# Patient Record
Sex: Female | Born: 1955 | Race: White | Hispanic: No | Marital: Married | Smoking: Never smoker
Health system: Southern US, Community
[De-identification: ages and names within clinical notes are randomized; demographics above are authoritative.]

## PROBLEM LIST (undated history)

## (undated) DIAGNOSIS — D649 Anemia, unspecified: Secondary | ICD-10-CM

## (undated) DIAGNOSIS — T148XXA Other injury of unspecified body region, initial encounter: Secondary | ICD-10-CM

## (undated) DIAGNOSIS — K9189 Other postprocedural complications and disorders of digestive system: Secondary | ICD-10-CM

## (undated) DIAGNOSIS — K567 Ileus, unspecified: Secondary | ICD-10-CM

## (undated) HISTORY — PX: TONSILLECTOMY: SUR1361

## (undated) HISTORY — PX: BACK SURGERY: SHX140

## (undated) HISTORY — DX: Anemia, unspecified: D64.9

---

## 2005-10-18 ENCOUNTER — Encounter: Admission: RE | Admit: 2005-10-18 | Discharge: 2005-10-18 | Payer: Self-pay | Admitting: Family Medicine

## 2006-06-29 ENCOUNTER — Other Ambulatory Visit: Admission: RE | Admit: 2006-06-29 | Discharge: 2006-06-29 | Payer: Self-pay | Admitting: Obstetrics & Gynecology

## 2006-11-08 ENCOUNTER — Encounter: Admission: RE | Admit: 2006-11-08 | Discharge: 2006-11-08 | Payer: Self-pay | Admitting: Family Medicine

## 2009-11-20 ENCOUNTER — Emergency Department (HOSPITAL_COMMUNITY): Admission: EM | Admit: 2009-11-20 | Discharge: 2009-11-21 | Payer: Self-pay | Admitting: Emergency Medicine

## 2009-11-22 ENCOUNTER — Telehealth: Payer: Self-pay | Admitting: Family Medicine

## 2009-11-22 ENCOUNTER — Ambulatory Visit: Payer: Self-pay | Admitting: Family Medicine

## 2009-11-22 ENCOUNTER — Other Ambulatory Visit
Admission: RE | Admit: 2009-11-22 | Discharge: 2009-11-22 | Payer: Self-pay | Source: Home / Self Care | Admitting: Family Medicine

## 2009-11-22 DIAGNOSIS — R599 Enlarged lymph nodes, unspecified: Secondary | ICD-10-CM | POA: Insufficient documentation

## 2009-11-22 LAB — CONVERTED CEMR LAB: Pap Smear: NEGATIVE

## 2009-11-23 ENCOUNTER — Ambulatory Visit: Payer: Self-pay | Admitting: Family Medicine

## 2009-11-23 ENCOUNTER — Ambulatory Visit: Payer: Self-pay | Admitting: Oncology

## 2009-11-23 LAB — CONVERTED CEMR LAB
ALT: 20 units/L (ref 0–35)
BUN: 9 mg/dL (ref 6–23)
Basophils Absolute: 0.1 10*3/uL (ref 0.0–0.1)
Bilirubin, Direct: 0 mg/dL (ref 0.0–0.3)
CA 125: 4.4 units/mL (ref 0.0–30.2)
Chloride: 106 meq/L (ref 96–112)
Cholesterol: 205 mg/dL — ABNORMAL HIGH (ref 0–200)
Creatinine, Ser: 0.9 mg/dL (ref 0.4–1.2)
Direct LDL: 95.3 mg/dL
Eosinophils Absolute: 0.1 10*3/uL (ref 0.0–0.7)
Glucose, Bld: 98 mg/dL (ref 70–99)
HCT: 36.7 % (ref 36.0–46.0)
LDH: 164 units/L (ref 94–250)
Lymphs Abs: 1 10*3/uL (ref 0.7–4.0)
MCHC: 33.8 g/dL (ref 30.0–36.0)
MCV: 95 fL (ref 78.0–100.0)
Monocytes Absolute: 0.4 10*3/uL (ref 0.1–1.0)
Neutrophils Relative %: 69.8 % (ref 43.0–77.0)
Platelets: 260 10*3/uL (ref 150.0–400.0)
Potassium: 3.8 meq/L (ref 3.5–5.1)
RDW: 12.6 % (ref 11.5–14.6)
TSH: 2.26 microintl units/mL (ref 0.35–5.50)
Total Bilirubin: 0.8 mg/dL (ref 0.3–1.2)
VLDL: 12.8 mg/dL (ref 0.0–40.0)

## 2009-11-24 ENCOUNTER — Encounter: Admission: RE | Admit: 2009-11-24 | Discharge: 2009-11-24 | Payer: Self-pay | Admitting: Family Medicine

## 2009-11-24 LAB — HM MAMMOGRAPHY

## 2009-11-26 ENCOUNTER — Telehealth: Payer: Self-pay | Admitting: Family Medicine

## 2010-01-04 ENCOUNTER — Telehealth: Payer: Self-pay | Admitting: Family Medicine

## 2010-03-10 ENCOUNTER — Telehealth: Payer: Self-pay | Admitting: Family Medicine

## 2010-06-13 ENCOUNTER — Ambulatory Visit: Payer: Self-pay | Admitting: Family Medicine

## 2010-06-13 LAB — CONVERTED CEMR LAB
ALT: 37 units/L — ABNORMAL HIGH (ref 0–35)
AST: 66 units/L — ABNORMAL HIGH (ref 0–37)
Albumin: 4.3 g/dL (ref 3.5–5.2)
Alkaline Phosphatase: 92 units/L (ref 39–117)
BUN: 16 mg/dL (ref 6–23)
Basophils Absolute: 0 10*3/uL (ref 0.0–0.1)
Basophils Relative: 0.6 % (ref 0.0–3.0)
Bilirubin, Direct: 0.1 mg/dL (ref 0.0–0.3)
CO2: 33 meq/L — ABNORMAL HIGH (ref 19–32)
Calcium: 9.9 mg/dL (ref 8.4–10.5)
Chloride: 100 meq/L (ref 96–112)
Creatinine, Ser: 1 mg/dL (ref 0.4–1.2)
Eosinophils Absolute: 0.3 10*3/uL (ref 0.0–0.7)
Eosinophils Relative: 4.6 % (ref 0.0–5.0)
GFR calc non Af Amer: 64.72 mL/min (ref >60)
Glucose, Bld: 93 mg/dL (ref 70–99)
HCT: 38.7 % (ref 36.0–46.0)
Hemoglobin: 12.9 g/dL (ref 12.0–15.0)
Lymphocytes Relative: 28.7 % (ref 12.0–46.0)
Lymphs Abs: 1.9 10*3/uL (ref 0.7–4.0)
MCHC: 33.3 g/dL (ref 30.0–36.0)
MCV: 94.2 fL (ref 78.0–100.0)
Monocytes Absolute: 0.6 10*3/uL (ref 0.1–1.0)
Monocytes Relative: 8.5 % (ref 3.0–12.0)
Neutro Abs: 3.8 10*3/uL (ref 1.4–7.7)
Neutrophils Relative %: 57.6 % (ref 43.0–77.0)
Platelets: 279 10*3/uL (ref 150.0–400.0)
Potassium: 5.3 meq/L — ABNORMAL HIGH (ref 3.5–5.1)
RBC: 4.1 M/uL (ref 3.87–5.11)
RDW: 13.6 % (ref 11.5–14.6)
Sodium: 142 meq/L (ref 135–145)
Total Bilirubin: 0.5 mg/dL (ref 0.3–1.2)
Total Protein: 7.2 g/dL (ref 6.0–8.3)
WBC: 6.6 10*3/uL (ref 4.5–10.5)

## 2010-06-15 DIAGNOSIS — L820 Inflamed seborrheic keratosis: Secondary | ICD-10-CM | POA: Insufficient documentation

## 2010-06-16 ENCOUNTER — Telehealth: Payer: Self-pay | Admitting: Family Medicine

## 2010-06-21 ENCOUNTER — Ambulatory Visit (HOSPITAL_COMMUNITY): Admission: RE | Admit: 2010-06-21 | Discharge: 2010-06-21 | Payer: Self-pay | Admitting: Oncology

## 2010-07-06 ENCOUNTER — Telehealth: Payer: Self-pay | Admitting: Family Medicine

## 2010-09-18 ENCOUNTER — Encounter: Payer: Self-pay | Admitting: Oncology

## 2010-09-18 ENCOUNTER — Encounter: Payer: Self-pay | Admitting: Family Medicine

## 2010-09-27 NOTE — Progress Notes (Signed)
Summary: ativan refill  Phone Note Refill Request Message from:  Fax from Pharmacy on July 06, 2010 11:35 AM  Refills Requested: Medication #1:  ATIVAN 1 MG TABS 1 tab @ bedtime Initial call taken by: Kern Reap CMA [name]),  July 06, 2010 11:35 AM    Prescriptions: ATIVAN 1 MG TABS (LORAZEPAM) 1 tab @ bedtime  #30 x 5   Entered by:   Kern Reap CMA (AAMA)   Authorized by:   [name] MD   Signed by:   Kern Reap CMA (AAMA) on 07/06/2010   Method used:   Handwritten   RxID:   [id number]

## 2010-09-27 NOTE — Assessment & Plan Note (Signed)
Summary: NEW PT EST (POST ED VISIT F/U) // RS   Vital Signs:  Patient profile:   55 year old female Height:      68.5 inches Weight:      212 pounds BMI:     31.88 Temp:     98.6 degrees F oral  Vitals Entered By: Kern Reap CMA [name]) (November 22, 2009 4:21 PM) CC: follow-up visit ER, new to establish care Is Patient Diabetic? No   CC:  follow-up visit ER and new to establish care.  History of Present Illness: Tracy Bradley is any 55 year old, married female, nonsmoker gravida 4, para 3 postmenopausal LMP at age 54, who comes in today for evaluation of possible lymphoma.  Over the weekend on Saturday she develop severe pain and went to the emergency room.  Her symptoms were consistent with a kidney stone.  CT scan showed a 3-mm calcified lesion in the bladder, which they felt was the stone.  However, it also showed enlargement of the mesenteric lymph nodes.  No associated increase in the retroperitoneal lymph nodes were noted.  Over the past couple months.  She has noticed some bloating....... two weeks ago   She had some fever for a couple hours not  associated with sore throat, etc.  She has had no  weight loss.  She has some night sweats, but that's been pretty persistent since she went through menopause, not changed recently.   her last physical and Pap and mammogram were about two years ago.  Review of systems otherwise negative  she will have a copy of all her vaccinations.  Faxed to me .    Preventive Screening-Counseling & Management  Alcohol-Tobacco     Smoking Status: never  Hep-HIV-STD-Contraception     Dental Visit-last 6 months yes      Drug Use:  no.    Allergies (verified): No Known Drug Allergies  Past History:  Past medical, surgical, family and social histories (including risk factors) reviewed, and no changes noted (except as noted below).  Past Surgical History: back surgery Tonsillectomy  Family History: Reviewed history and no changes  required. Father: non-hodgkins lympoma Mother: thyroid  Siblings: 2 brothers - healthy Children: 3 - healthy  Social History: Reviewed history and no changes required. 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 No apparent alopecia or balding. Eyes:  No corneal or conjunctival inflammation noted. EOMI. Perrla. Funduscopic exam benign, without hemorrhages, exudates or papilledema. Vision grossly normal. Ears:  External ear exam shows no significant lesions or deformities.  Otoscopic examination reveals clear canals, tympanic membranes are intact bilaterally without bulging, retraction, inflammation or discharge. Hearing is grossly normal bilaterally. Nose:  External nasal examination shows no deformity or inflammation. Nasal mucosa are pink and moist without lesions or exudates. Mouth:  Oral mucosa and oropharynx without lesions or exudates.  Teeth in good repair. Neck:  No deformities, masses, or tenderness noted. Chest Wall:  No deformities, masses, or tenderness noted. Breasts:  No mass, nodules, thickening, tenderness, bulging, retraction, inflamation, nipple discharge or skin changes noted.   Lungs:  Normal respiratory effort, chest expands symmetrically. Lungs are clear to auscultation, no crackles or wheezes. Heart:  Normal rate and regular  rhythm. S1 and S2 normal without gallop, murmur, click, rub or other extra sounds. Abdomen:  Bowel sounds positive,abdomen soft and non-tender without masses, organomegaly or hernias noted. Rectal:  No external abnormalities noted. Normal sphincter tone. No rectal masses or tenderness. Genitalia:  externa  genitalia within normal limits.  Vaginal vault was normal.  Cervix was visualized.  Pap smear was done.  There is a cervical polyp.  She's been told there was a polyp in the past.  Bimanual exam negative Msk:  No deformity or scoliosis noted of thoracic or lumbar spine.   Pulses:  R and L carotid,radial,femoral,dorsalis pedis and posterior tibial pulses are full and equal bilaterally Extremities:  No clubbing, cyanosis, edema, or deformity noted with normal full range of motion of all joints.   Neurologic:  No cranial nerve deficits noted. Station and gait are normal. Plantar reflexes are down-going bilaterally. DTRs are symmetrical throughout. Sensory, motor and coordinative functions appear intact. Skin:  Intact without suspicious lesions or rashes Cervical Nodes:  No lymphadenopathy noted Axillary Nodes:  No palpable lymphadenopathy Inguinal Nodes:  No significant adenopathy Psych:  Cognition and judgment appear intact. Alert and cooperative with normal attention span and concentration. No apparent delusions, illusions, hallucinations   Impression & Recommendations:  Problem # 1:  LYMPHADENOPATHY (ICD-785.6) Assessment New  Orders: EKG w/ Interpretation (93000) Venipuncture (16109) T-CA 125 (60454-09811) T-Lactate Dehydrogenase (LDH) (91478-29562) T- * Misc. Laboratory test [phone]) TLB-Lipid Panel (80061-LIPID) TLB-BMP (Basic Metabolic Panel-BMET) (80048-METABOL) TLB-CBC Platelet - w/Differential (85025-CBCD) TLB-Hepatic/Liver Function Pnl (80076-HEPATIC) TLB-TSH (Thyroid Stimulating Hormone) (84443-TSH) T-2 View CXR (71020TC)  Complete Medication List: 1)  Ativan 1 Mg Tabs (Lorazepam) .Marland Kitchen.. 1 tab @ bedtime  Patient Instructions: 1)  call and get your mammogram tomorrow.  Also, chest x-ray, and I will call in two to set up for an oncology appointment ASAP 2)  Take an Aspirin every day. Prescriptions: ATIVAN 1 MG TABS (LORAZEPAM) 1 tab @ bedtime  #30 x 3   Entered and  Authorized by:   [name] MD   Signed by:   [name] MD on 11/22/2009   Method used:   Print then Give to Patient   RxID:   [id number]

## 2010-09-27 NOTE — Progress Notes (Signed)
Summary: lab results  Phone Note Call from Patient Call back at Home Phone [phone]   Caller: Patient Call For: [name] MD Reason for Call: Lab or Test Results Summary of Call: patient is calling because she took 5-6 Motrin on Sunday night.  she would like to know if this could cause an increase in her AST, ALT results? Initial call taken by: Kern Reap CMA [name]),  June 16, 2010 10:17 AM  Follow-up for Phone Call        no Follow-up by: [name] MD,  June 16, 2010 12:56 PM  Additional Follow-up for Phone Call Additional follow up Details #1::        Phone Call Completed Additional Follow-up by: Kern Reap CMA [name]),  June 17, 2010 8:46 AM

## 2010-09-27 NOTE — Progress Notes (Signed)
Summary: RETURN CALL  Phone Note Call from Patient   Caller: Patient  [phone] Reason for Call: Talk to Doctor Summary of Call: Pt adv that she was returning Dr Nelida Meuse call.... Please return call at [phone].  Initial call taken by: [name],  November 26, 2009 8:04 AM  Follow-up for Phone Call        good news from Dr Truett Perna Follow-up by: Kern Reap CMA [name]),  November 26, 2009 12:01 PM

## 2010-09-27 NOTE — Assessment & Plan Note (Signed)
Summary: consult re: suspicious looking lesion on chest/cjr   Vital Signs:  Patient profile:   55 year old female Weight:      201 pounds Temp:     98.3 degrees F oral BP sitting:   120 / 78  (left arm) Cuff size:   regular  Vitals Entered By: Kern Reap CMA [name]) (June 13, 2010 12:08 PM)  Procedure Note  Mole Biopsy/Removal: Indication: rule out cancer Consent signed: yes  Procedure # 1: elliptical incision with 3 mm margin    Size (in cm): 12 mm x 14mm    Region: anterior    Location: chest-upper-midline    Instrument used: #15 blade    Anesthesia: 1% lidocaine w/epinephrine    Closure: cautery  Cleaned and prepped with: alcohol Wound dressing: neosporin and bandaid  CC: red area upper chest   CC:  red area upper chest.  History of Present Illness:  Tracy Bradley is a 55 year old red haired female, who comes in today for evaluation of a lesion on her anterior chest wall has been growing.    Allergies: No Known Drug Allergies   Complete Medication List: 1)  Ativan 1 Mg Tabs (Lorazepam) .Marland Kitchen.. 1 tab @ bedtime 2)  Premarin 0.625 Mg/gm Crea (Estrogens, conjugated) .... Use as directed  Other Orders: Venipuncture (09811) T-LDH Isoenzyme (91478-29562) T- * Misc. Laboratory test [phone]) Specimen Handling (57846) TLB-BMP (Basic Metabolic Panel-BMET) (80048-METABOL) TLB-CBC Platelet - w/Differential (85025-CBCD) TLB-Hepatic/Liver Function Pnl (80076-HEPATIC) Shave Skin Lesion 1.1-2.0 cm/trunk/arm/leg (96295)   Orders Added: 1)  Venipuncture [28413] 2)  T-LDH Isoenzyme [24401-02725] 3)  T- * Misc. Laboratory test [99999] 4)  Specimen Handling [99000] 5)  TLB-BMP (Basic Metabolic Panel-BMET) [80048-METABOL] 6)  TLB-CBC Platelet - w/Differential [85025-CBCD] 7)  TLB-Hepatic/Liver Function Pnl [80076-HEPATIC] 8)  Shave Skin Lesion 1.1-2.0 cm/trunk/arm/leg [36644]

## 2010-09-27 NOTE — Progress Notes (Signed)
Summary: ativan refill  Phone Note Refill Request Message from:  Fax from Pharmacy on March 10, 2010 5:03 PM  Refills Requested: Medication #1:  ATIVAN 1 MG TABS 1 tab @ bedtime Initial call taken by: Kern Reap CMA (AAMA),  March 10, 2010 5:03 PM    Prescriptions: ATIVAN 1 MG TABS (LORAZEPAM) 1 tab @ bedtime  #30 x 3   Entered by:   Kern Reap CMA (AAMA)   Authorized by:   [name] MD   Signed by:   Kern Reap CMA (AAMA) on 03/10/2010   Method used:   Telephoned to ...       [facility]  [place] [address] (retail)       [address]       [place], [place]  [address]       Ph: [phone] or [phone]       Fax: [phone]   RxID:   [id number]

## 2010-09-27 NOTE — Progress Notes (Signed)
Summary: REQ FOR NEW  PT APPT  Phone Note Call from Patient   Caller: Spouse  [name])  [phone] Reason for Call: Talk to Doctor Summary of Call: [name] called to see if there was anyway that his wife can be seen either today or tomorrow as a new pt.... His wife went to the ED w/ sxs of flank pain, nausea...Marland KitchenMarland Kitchen Pt was dx w/ kidney stones but during the scan, they found something they were concerned with and adv pt that it may be lymphoma??  Okay to schedule pt for appt as a new pt est tomorrow at 12:15pm slot??  Pt / Pts husband can be reached at [phone] with any questions or concerns.  Please advise when you get a chance..... Thanks,  Respectfully,   [name] / Scheduling Initial call taken by: [name],  November 22, 2009 1:37 PM  Follow-up for Phone Call        [name] please call and get her in today Follow-up by: [name] MD,  November 22, 2009 1:44 PM  Additional Follow-up for Phone Call Additional follow up Details #1::        Phone Call Completed Additional Follow-up by: Kern Reap CMA [name]),  November 22, 2009 2:06 PM     Appended Document: REQ FOR NEW  PT APPT Appt scheduled for today, 11/22/2009 at 4:30pm..... Okay per Dr Tawanna Cooler.

## 2010-09-27 NOTE — Progress Notes (Signed)
Summary: new rx?  Phone Note Call from Patient Call back at Home Phone [phone]   Caller: Patient---live call Summary of Call: Was seen a few weeks ago and Dr Tawanna Cooler was going to rx'd some estrogen cream. Please [facility] in a [facility]. Initial call taken by: Warnell Forester,  Jan 04, 2010 3:23 PM  Follow-up for Phone Call        Premarin vaginal cream, dispense 3 tubes, use as directed, refill x 6 Follow-up by: [name] MD,  Jan 04, 2010 4:05 PM    New/Updated Medications: PREMARIN 0.625 MG/GM CREA (ESTROGENS, CONJUGATED) use as directed Prescriptions: PREMARIN 0.625 MG/GM CREA (ESTROGENS, CONJUGATED) use as directed  #3 tubes x 6   Entered by:   Kern Reap CMA (AAMA)   Authorized by:   [name] MD   Signed by:   Kern Reap CMA (AAMA) on 01/04/2010   Method used:   [facility]        [facility]  [place] [address]       [address]       [place], [place]  [address]       Ph: [phone] or [phone]       Fax: [phone]   RxID:   [id number]

## 2010-11-21 LAB — URINE MICROSCOPIC-ADD ON

## 2010-11-21 LAB — URINALYSIS, ROUTINE W REFLEX MICROSCOPIC
Bilirubin Urine: NEGATIVE
Specific Gravity, Urine: 1.025 (ref 1.005–1.030)
pH: 5.5 (ref 5.0–8.0)

## 2010-11-21 LAB — POCT I-STAT, CHEM 8
Calcium, Ion: 1.22 mmol/L (ref 1.12–1.32)
HCT: 38 % (ref 36.0–46.0)
Hemoglobin: 12.9 g/dL (ref 12.0–15.0)
TCO2: 27 mmol/L (ref 0–100)

## 2010-12-28 ENCOUNTER — Other Ambulatory Visit: Payer: Self-pay | Admitting: *Deleted

## 2010-12-28 MED ORDER — LORAZEPAM 1 MG PO TABS: 1.0000 mg | ORAL_TABLET | Freq: Every day | ORAL | Status: AC

## 2011-05-14 ENCOUNTER — Other Ambulatory Visit: Payer: Self-pay | Admitting: Family Medicine

## 2011-05-30 ENCOUNTER — Other Ambulatory Visit: Payer: Self-pay | Admitting: *Deleted

## 2011-05-30 MED ORDER — LORAZEPAM 1 MG PO TABS: 1.0000 mg | ORAL_TABLET | Freq: Three times a day (TID) | ORAL | Status: AC

## 2011-07-24 ENCOUNTER — Telehealth: Payer: Self-pay | Admitting: Family Medicine

## 2011-08-09 ENCOUNTER — Telehealth: Payer: Self-pay

## 2011-08-09 MED ORDER — FLUCONAZOLE 100 MG PO TABS: ORAL_TABLET | ORAL | Status: DC

## 2011-08-09 NOTE — Telephone Encounter (Signed)
Pt states she was given an antibiotic for a route canal and now pt has a yeast infection.  Pt would like rx sent to pharmacy.

## 2011-08-09 NOTE — Telephone Encounter (Signed)
rx sent.  Left message on machine for patient.

## 2011-08-16 ENCOUNTER — Ambulatory Visit (INDEPENDENT_AMBULATORY_CARE_PROVIDER_SITE_OTHER): Payer: BC Managed Care – PPO | Admitting: Family Medicine

## 2011-08-16 ENCOUNTER — Telehealth: Payer: Self-pay | Admitting: Family Medicine

## 2011-08-16 ENCOUNTER — Encounter: Payer: Self-pay | Admitting: Family Medicine

## 2011-08-16 DIAGNOSIS — S239XXA Sprain of unspecified parts of thorax, initial encounter: Secondary | ICD-10-CM

## 2011-08-16 NOTE — Patient Instructions (Signed)
Muscle Strain A muscle strain, or pulled muscle, occurs when a muscle is over-stretched. A small number of muscle fibers may also be torn. This is especially common in athletes. This happens when a sudden violent force placed on a muscle pushes it past its capacity. Usually, recovery from a pulled muscle takes 1 to 2 weeks. But complete healing will take 5 to 6 weeks. There are millions of muscle fibers. Following injury, your body will usually return to normal quickly. HOME CARE INSTRUCTIONS   While awake, apply ice to the sore muscle for 15 to 20 minutes each hour for the first 2 days. Put ice in a plastic bag and place a towel between the bag of ice and your skin.   Do not use the pulled muscle for several days. Do not use the muscle if you have pain.   You may wrap the injured area with an elastic bandage for comfort. Be careful not to bind it too tightly. This may interfere with blood circulation.   Only take over-the-counter or prescription medicines for pain, discomfort, or fever as directed by your caregiver. Do not use aspirin as this will increase bleeding (bruising) at injury site.   Warming up before exercise helps prevent muscle strains.  SEEK MEDICAL CARE IF:  There is increased pain or swelling in the affected area. MAKE SURE YOU:   Understand these instructions.   Will watch your condition.   Will get help right away if you are not doing well or get worse.  Document Released: 08/14/2005 Document Revised: 04/26/2011 Document Reviewed: 03/13/2007 Exit[employer] Patient Information 2012 [employer]. 

## 2011-08-16 NOTE — Telephone Encounter (Signed)
Patient called back and went to an urgent care center. They  told her to see a doctor asap today. So I offered her [place] @ 2:45. Patient is requesting to come in earlier to do an urine for possible uti. Please advise. Thanks.

## 2011-08-16 NOTE — Telephone Encounter (Signed)
[facility] and they gave her abx. She wants to make sure that everything is fine,before travelling this weekend, and wants to come in today only for an urine test. I told her that she will need to see a md. So she went ahead and made an appt for tomorrow with dr todd. Please advise this patient and return her call. Thanks.

## 2011-08-16 NOTE — Telephone Encounter (Signed)
Spoke with patient and an appointment made 

## 2011-08-16 NOTE — Progress Notes (Signed)
  Subjective:    Patient ID: Tracy Bradley, female    DOB: 03/22/1956, 55 y.o.   MRN: [id number]  HPI 55 year old white female, in with complaints of back pain has been going on for about a week. She was seen at the [facility] diagnosed with a urinary tract infection, given a Z-Pak. She has completed the medication and denies any urinary frequency or urgency, no blood. However she has mid thoracic back pain. She hasn't taken Motrin and Tylenol helps. Describes it as a constant he, rated as 6/10 on the adult pain scale. The patient believes she may have strained her back pain 10 tree.  Review of Systems  Constitutional: Negative.   Respiratory: Negative.   Cardiovascular: Negative.   Gastrointestinal: Negative.   Genitourinary: Negative.   Musculoskeletal: Positive for back pain.  Neurological: Negative.    No past medical history on file.  History   Social History  . Marital Status: Married    Spouse Name: N/A    Number of Children: N/A  . Years of Education: N/A   Occupational History  . Not on file.   Social History Main Topics  . Smoking status: Never Smoker   . Smokeless tobacco: Not on file  . Alcohol Use: Yes  . Drug Use:   . Sexually Active:    Other Topics Concern  . Not on file   Social History Narrative  . No narrative on file    Past Surgical History  Procedure Date  . Back surgery   . Tonsillectomy     Family History  Problem Relation Age of Onset  . Lymphoma Father     No Known Allergies  Current Outpatient Prescriptions on File Prior to Visit  Medication Sig Dispense Refill  . fluconazole (DIFLUCAN) 100 MG tablet Take one tab now and one in 5 days  2 tablet  1  . PREMARIN vaginal cream USE AS DIRECTED  30 g  0    BP 120/80  Pulse 91  Temp(Src) 98.5 F (36.9 C) (Oral)  Wt 200 lb (90.719 kg)  SpO2 97%chart    Objective:   Physical Exam  Constitutional: She is oriented to person, place, and time. She appears well-developed and  well-nourished.  Neck: Normal range of motion. Neck supple.  Cardiovascular: Normal rate, regular rhythm and normal heart sounds.   Pulmonary/Chest: Effort normal and breath sounds normal.  Musculoskeletal: Normal range of motion.       Tenderness to palpation of the left and right thoracic spine. No spinal tenderness. Worse with ROM.  Neurological: She is alert and oriented to person, place, and time. She has normal reflexes.  Skin: Skin is warm and dry.  Psychiatric: She has a normal mood and affect.     Urinalysis: Within normal limits    Assessment & Plan:  Assessment: Thoracic back pain/strain  Plan: Over-the-counter ibuprofen 600 mg when necessary. Ice and heat alternating. Call if symptoms worsen or persist. Recheck as scheduled and when necessary

## 2011-08-16 NOTE — Telephone Encounter (Signed)
Pt had appt with Nurse Orvan Falconer today at 2:45

## 2011-08-17 ENCOUNTER — Ambulatory Visit: Payer: Self-pay | Admitting: Family Medicine

## 2011-11-16 ENCOUNTER — Other Ambulatory Visit: Payer: Self-pay | Admitting: *Deleted

## 2011-11-16 MED ORDER — LORAZEPAM 1 MG PO TABS: 1.0000 mg | ORAL_TABLET | Freq: Every evening | ORAL | Status: AC | PRN

## 2012-02-21 ENCOUNTER — Other Ambulatory Visit: Payer: Self-pay | Admitting: *Deleted

## 2012-02-21 ENCOUNTER — Telehealth: Payer: Self-pay | Admitting: Family Medicine

## 2012-02-21 ENCOUNTER — Encounter: Payer: Self-pay | Admitting: Internal Medicine

## 2012-02-21 ENCOUNTER — Ambulatory Visit (INDEPENDENT_AMBULATORY_CARE_PROVIDER_SITE_OTHER)
Admission: RE | Admit: 2012-02-21 | Discharge: 2012-02-21 | Disposition: A | Discharge status: Home / Self Care | Payer: BC Managed Care – PPO | Source: Ambulatory Visit | Attending: Internal Medicine | Admitting: Internal Medicine

## 2012-02-21 ENCOUNTER — Ambulatory Visit (INDEPENDENT_AMBULATORY_CARE_PROVIDER_SITE_OTHER): Payer: BC Managed Care – PPO | Admitting: Internal Medicine

## 2012-02-21 VITALS — BP 130/80 | HR 77 | Temp 98.0°F | Wt 203.0 lb

## 2012-02-21 DIAGNOSIS — R0602 Shortness of breath: Secondary | ICD-10-CM | POA: Insufficient documentation

## 2012-02-21 DIAGNOSIS — M546 Pain in thoracic spine: Secondary | ICD-10-CM

## 2012-02-21 LAB — CBC WITH DIFFERENTIAL/PLATELET
Eosinophils Relative: 1.1 % (ref 0.0–5.0)
MCV: 93.5 fl (ref 78.0–100.0)
Monocytes Absolute: 0.5 10*3/uL (ref 0.1–1.0)
Neutrophils Relative %: 78.1 % — ABNORMAL HIGH (ref 43.0–77.0)
Platelets: 259 10*3/uL (ref 150.0–400.0)
WBC: 7.8 10*3/uL (ref 4.5–10.5)

## 2012-02-21 LAB — BRAIN NATRIURETIC PEPTIDE: Pro B Natriuretic peptide (BNP): 80 pg/mL (ref 0.0–100.0)

## 2012-02-21 LAB — BASIC METABOLIC PANEL
BUN: 8 mg/dL (ref 6–23)
Chloride: 108 mEq/L (ref 96–112)
Creatinine, Ser: 0.7 mg/dL (ref 0.4–1.2)
GFR: 97.38 mL/min (ref >60.00)

## 2012-02-21 LAB — D-DIMER, QUANTITATIVE: D-Dimer, Quant: 0.4 ug/mL-FEU (ref 0.00–0.48)

## 2012-02-21 MED ORDER — LORAZEPAM 1 MG PO TABS: 1.0000 mg | ORAL_TABLET | Freq: Every day | ORAL | Status: AC

## 2012-02-21 NOTE — Telephone Encounter (Signed)
Rx called in 

## 2012-02-21 NOTE — Telephone Encounter (Signed)
Caller: Tracy Bradley/Patient; PCP: [name].; CB#: [phone]; ; ; Call regarding Back Pain, SOB, HX of Kidney Stones; onset 6-25.  Pain in upper/mid Back, Pt had similar pain with privous stones.  Afebrile. Pt used Son's inhaler x5 days due to feeling anxious during Son's wedding. Back Symptoms Protocol used.  Appt scheduled with next available MD d/t stabbing pain from Back thru Abdomen w/ SOB.  Advised Pt to call back if sxs worsen before 2pm appt.  Pt verbalized understanding.

## 2012-02-21 NOTE — Assessment & Plan Note (Signed)
Progressive shortness of breath and 56 year old white female. Her exam is unremarkable. EKG showed normal sinus rhythm.  No ST changes. Oxygen saturation is 98%. Obtain chest x-ray. Obtain CBC, BNP and d-dimer. We discussed obtaining CT of chest if D-dimer is positive.  If above work up is negative, patient advised to follow up with PCP for possible further cardiac work up (2 D Echo).  If symptoms become severe, she understands to seek emergency medical care.

## 2012-02-21 NOTE — Progress Notes (Signed)
  Subjective:    Patient ID: Tracy Bradley, female    DOB: July 31, 1956, 56 y.o.   MRN: [id number]  HPI  56 year old white female complains of shortness of breath x1 week. Her symptoms started while she was preparing for a wedding. Her symptoms seem to progressively worsen over the last one week. She denies associated fever or cough. She tried to use her son's asthma inhaler but it made no difference. Patient then returned from a trip to [place] on June 22.   She notes she had some ankle swelling that she noticed during the wedding however this has resolved. She denies any calf tenderness.  Patient developed back pain over last 2 days. She describes a aching sensation that goes across her back at the thoracic level. She also reports mild left arm pain over last 2 days.  Review of Systems Negative for chest pain, no cough, no fever Negative for gastrointestinal complaints  Chart review:  She was seen in ER 06/21/2010 for possible kidney stone. CT of abd and pelvis showed - 1. Mildly progressive diffuse mesenteric adenopathy with  associated mesenteric edema. This remains concerning for the possibility of lymphoma.  2. No new abnormalities.  She was seen by oncologist - Dr. Myrle Sheng.  Extensive workup reported negative for lymphoma.  No family hx of premature CAD.  Father has pacemaker.    No past medical history on file.  History   Social History  . Marital Status: Married    Spouse Name: N/A    Number of Children: N/A  . Years of Education: N/A   Occupational History  . Not on file.   Social History Main Topics  . Smoking status: Never Smoker   . Smokeless tobacco: Not on file  . Alcohol Use: Yes  . Drug Use:   . Sexually Active:    Other Topics Concern  . Not on file   Social History Narrative  . No narrative on file    Past Surgical History  Procedure Date  . Back surgery   . Tonsillectomy     Family History  Problem Relation Age of Onset  . Lymphoma Father      No Known Allergies  Current Outpatient Prescriptions on File Prior to Visit  Medication Sig Dispense Refill  . PREMARIN vaginal cream USE AS DIRECTED  30 g  0    BP 130/80  Pulse 77  Temp 98 F (36.7 C) (Oral)  Wt 203 lb (92.08 kg)  SpO2 98%    Objective:   Physical Exam  Constitutional: She is oriented to person, place, and time. She appears well-developed and well-nourished.  HENT:  Head: Normocephalic and atraumatic.  Right Ear: External ear normal.  Left Ear: External ear normal.  Mouth/Throat: Oropharynx is clear and moist.  Eyes: EOM are normal. Pupils are equal, round, and reactive to light.  Neck: Neck supple.  Cardiovascular: Normal rate, regular rhythm and normal heart sounds.   Pulmonary/Chest: Effort normal and breath sounds normal. She has no wheezes. She has no rales.  Abdominal: Soft. Bowel sounds are normal. There is no tenderness.  Musculoskeletal:       Trace lower extremity edema  Lymphadenopathy:    She has no cervical adenopathy.  Neurological: She is alert and oriented to person, place, and time.  Skin: Skin is warm and dry.  Psychiatric: She has a normal mood and affect. Her behavior is normal.      Assessment & Plan:

## 2012-02-21 NOTE — Patient Instructions (Addendum)
We will contact you re: blood test and chest x ray results If your symptoms get worse, seek emergency medical care

## 2012-02-21 NOTE — Telephone Encounter (Signed)
Pt called and left a message with [facility] to call her back. Pt did not want to wait to get call back thinks she is having beginning signs of a kidney stone and really wanted to talk to [name], did not want to be sent back to [facility]

## 2012-02-21 NOTE — Telephone Encounter (Signed)
Patient is in need of a refill for her sleep medication.  Pharmacy is [facility].

## 2012-04-12 ENCOUNTER — Telehealth: Payer: Self-pay | Admitting: Family Medicine

## 2012-04-12 NOTE — Telephone Encounter (Signed)
Spoke with patient and she is able to keep fluids down.  She will call back if no improvement.

## 2012-04-12 NOTE — Telephone Encounter (Signed)
Caller: Rajah/Patient; Patient Name: Tracy Bradley; PCP: [name].; Best Callback Phone Number: [phone]. Onset 04/12/12. Pt states " I just don't feel well"  Pt has abdominal cramping a with vomiting.  She is unable to keep anything down. Temp  101 Orally 04/12/12 @ 1315. All emergemnt symptoms ruled out per Abdominal Pain with exception to 'Episodes of abdominal discomfort and occasionaly nausea and vomiting that are increasing in frequency'.  Home care advice given.  See Provider in 72 hrs.  No available appts for 04/12/2012. Sent note to office.

## 2012-04-13 ENCOUNTER — Emergency Department (HOSPITAL_COMMUNITY): Payer: BC Managed Care – PPO

## 2012-04-13 ENCOUNTER — Inpatient Hospital Stay (HOSPITAL_COMMUNITY)
Admission: EM | Admit: 2012-04-13 | Discharge: 2012-04-22 | DRG: 883 | Disposition: A | Discharge status: Home / Self Care | Payer: BC Managed Care – PPO | Attending: Surgery | Admitting: Surgery

## 2012-04-13 ENCOUNTER — Encounter (HOSPITAL_COMMUNITY): Payer: Self-pay | Admitting: *Deleted

## 2012-04-13 DIAGNOSIS — E669 Obesity, unspecified: Secondary | ICD-10-CM | POA: Diagnosis present

## 2012-04-13 DIAGNOSIS — T148XXA Other injury of unspecified body region, initial encounter: Secondary | ICD-10-CM | POA: Diagnosis not present

## 2012-04-13 DIAGNOSIS — R599 Enlarged lymph nodes, unspecified: Secondary | ICD-10-CM | POA: Diagnosis present

## 2012-04-13 DIAGNOSIS — L02219 Cutaneous abscess of trunk, unspecified: Secondary | ICD-10-CM | POA: Diagnosis not present

## 2012-04-13 DIAGNOSIS — I809 Phlebitis and thrombophlebitis of unspecified site: Secondary | ICD-10-CM | POA: Diagnosis not present

## 2012-04-13 DIAGNOSIS — K352 Acute appendicitis with generalized peritonitis, without abscess: Principal | ICD-10-CM | POA: Diagnosis present

## 2012-04-13 DIAGNOSIS — K37 Unspecified appendicitis: Secondary | ICD-10-CM

## 2012-04-13 DIAGNOSIS — K35209 Acute appendicitis with generalized peritonitis, without abscess, unspecified as to perforation: Principal | ICD-10-CM | POA: Diagnosis present

## 2012-04-13 DIAGNOSIS — X58XXXA Exposure to other specified factors, initial encounter: Secondary | ICD-10-CM | POA: Diagnosis not present

## 2012-04-13 DIAGNOSIS — K567 Ileus, unspecified: Secondary | ICD-10-CM | POA: Diagnosis not present

## 2012-04-13 DIAGNOSIS — Z6830 Body mass index (BMI) 30.0-30.9, adult: Secondary | ICD-10-CM

## 2012-04-13 DIAGNOSIS — K560 Paralytic ileus: Secondary | ICD-10-CM | POA: Diagnosis not present

## 2012-04-13 DIAGNOSIS — Z87442 Personal history of urinary calculi: Secondary | ICD-10-CM

## 2012-04-13 DIAGNOSIS — E871 Hypo-osmolality and hyponatremia: Secondary | ICD-10-CM

## 2012-04-13 HISTORY — DX: Other postprocedural complications and disorders of digestive system: K91.89

## 2012-04-13 HISTORY — DX: Ileus, unspecified: K56.7

## 2012-04-13 HISTORY — DX: Other injury of unspecified body region, initial encounter: T14.8XXA

## 2012-04-13 LAB — CBC WITH DIFFERENTIAL/PLATELET
Eosinophils Relative: 0 % (ref 0–5)
HCT: 36 % (ref 36.0–46.0)
Hemoglobin: 12.5 g/dL (ref 12.0–15.0)
Lymphocytes Relative: 6 % — ABNORMAL LOW (ref 12–46)
Lymphs Abs: 0.6 10*3/uL — ABNORMAL LOW (ref 0.7–4.0)
MCV: 89.3 fL (ref 78.0–100.0)
Monocytes Absolute: 0.4 10*3/uL (ref 0.1–1.0)
Monocytes Relative: 4 % (ref 3–12)
RBC: 4.03 MIL/uL (ref 3.87–5.11)
WBC: 10.5 10*3/uL (ref 4.0–10.5)

## 2012-04-13 LAB — URINALYSIS, ROUTINE W REFLEX MICROSCOPIC
Ketones, ur: 15 mg/dL — AB
Protein, ur: NEGATIVE mg/dL
Urobilinogen, UA: 0.2 mg/dL (ref 0.0–1.0)

## 2012-04-13 LAB — COMPREHENSIVE METABOLIC PANEL
CO2: 24 mEq/L (ref 19–32)
Calcium: 9.3 mg/dL (ref 8.4–10.5)
Chloride: 90 mEq/L — ABNORMAL LOW (ref 96–112)
Creatinine, Ser: 0.71 mg/dL (ref 0.50–1.10)
GFR calc Af Amer: >90 mL/min (ref >90)
GFR calc non Af Amer: >90 mL/min (ref >90)
Glucose, Bld: 118 mg/dL — ABNORMAL HIGH (ref 70–99)
Total Bilirubin: 1.5 mg/dL — ABNORMAL HIGH (ref 0.3–1.2)

## 2012-04-13 LAB — URINE MICROSCOPIC-ADD ON

## 2012-04-13 MED ORDER — MORPHINE SULFATE 4 MG/ML IJ SOLN
4.0000 mg | Freq: Once | INTRAMUSCULAR | Status: AC
  Administered 2012-04-13: 4 mg via INTRAVENOUS
  Filled 2012-04-13: qty 1

## 2012-04-13 MED ORDER — IOHEXOL 300 MG/ML  SOLN: 100.0000 mL | Freq: Once | INTRAMUSCULAR | Status: AC | PRN

## 2012-04-13 MED ORDER — SODIUM CHLORIDE 0.9 % IV BOLUS (SEPSIS)
1000.0000 mL | Freq: Once | INTRAVENOUS | Status: AC
  Administered 2012-04-13: 1000 mL via INTRAVENOUS

## 2012-04-13 MED ORDER — ONDANSETRON HCL 4 MG/2ML IJ SOLN
4.0000 mg | Freq: Once | INTRAMUSCULAR | Status: AC
  Administered 2012-04-13: 4 mg via INTRAVENOUS
  Filled 2012-04-13: qty 2

## 2012-04-13 NOTE — ED Notes (Signed)
Pt reports generalized lower abdominal pain x2 days that localized to right lower abdomen today.  Pt reports nausea and vomiting x2 days, but reports has not had vomiting today and was able to keep fluids down. Pt reports she has had a fever with highest of 101. She has been taking tylenoland ibuprofen for pain and fever earlier today.  Pt reports last dose of tylenol was 7PM today.  Pt reports abdomen feels hard and bloated.  Pt denies dysuria or increased frequency.  Pt denies vaginal discharge.

## 2012-04-13 NOTE — ED Notes (Signed)
Pt c/o fever and abdominal pain since Thursday. Pt states epigastric pain initially then concentrated in RLQ and umbilicus. Pt states n/v on Thur, able to eat yesterday, unable to eat today.

## 2012-04-13 NOTE — ED Provider Notes (Signed)
History     CSN: [id number]  Arrival date & time 04/13/12  2019   First MD Initiated Contact with Patient 04/13/12 2135      Chief Complaint  Patient presents with  . Fever  . Abdominal Pain   HPI  History provided by the patient. Patient is a 56 year old female with history of tubal ligation who presents with complaints of right lower quadrant abdominal pain and fever at home. Patient states that symptoms began with generalized abdominal uneasiness and discomfort 2 nights ago. Symptoms were associated with some nausea and decreased appetite. Patient felt slight improvements on Friday and this morning the pain returned acutely and much more severe. Pain is now focused in the right lower quadrant. Pain is worse with movements and better with lying completely still. Patient has had subjective fevers and chills at home. She has not taken any medications for symptoms. She denies any vomiting, diarrhea constipation symptoms. She denies any dysuria, hematuria, flank pain or urinary frequency.    History reviewed. No pertinent past medical history.  Past Surgical History  Procedure Date  . Back surgery   . Tonsillectomy     Family History  Problem Relation Age of Onset  . Lymphoma Father     History  Substance Use Topics  . Smoking status: Never Smoker   . Smokeless tobacco: Not on file  . Alcohol Use: No    OB History    Grav Para Term Preterm Abortions TAB SAB Ect Mult Living                  Review of Systems  Constitutional: Positive for fever, chills and appetite change.  Respiratory: Negative for shortness of breath.   Cardiovascular: Negative for chest pain.  Gastrointestinal: Positive for abdominal pain. Negative for nausea, vomiting, diarrhea and constipation.  Genitourinary: Negative for dysuria, frequency, hematuria and flank pain.    Allergies  Review of patient's allergies indicates no known allergies.  Home Medications   Current Outpatient Rx  Name  Route Sig Dispense Refill  . LORAZEPAM 1 MG PO TABS Oral Take 1 mg by mouth at bedtime.      BP 101/57  Pulse 92  Temp 99.1 F (37.3 C) (Oral)  Resp 20  Ht 5\' 8"  (1.727 m)  Wt 200 lb (90.719 kg)  BMI 30.41 kg/m2  SpO2 96%  Physical Exam  Nursing note and vitals reviewed. Constitutional: She is oriented to person, place, and time. She appears well-developed and well-nourished. No distress.  HENT:  Head: Normocephalic.  Cardiovascular: Normal rate and regular rhythm.   No murmur heard. Pulmonary/Chest: Effort normal and breath sounds normal. No respiratory distress. She has no wheezes. She has no rales.  Abdominal: Soft. There is tenderness. There is rebound and tenderness at McBurney's point. There is no rigidity, no guarding, no CVA tenderness and negative Murphy's sign.       There is diffuse tenderness but severe tenderness in the right lower quadrant. Patient does have rebounding. No Rovsing sign.  Neurological: She is alert and oriented to person, place, and time.  Skin: Skin is warm and dry.  Psychiatric: She has a normal mood and affect. Her behavior is normal.    ED Course  Procedures  Results for orders placed during the hospital encounter of 04/13/12  CBC WITH DIFFERENTIAL      Component Value Range   WBC 10.5  4.0 - 10.5 K/uL   RBC 4.03  3.87 - 5.11 MIL/uL   Hemoglobin  12.5  12.0 - 15.0 g/dL   HCT 82.9  56.2 - 13.0 %   MCV 89.3  78.0 - 100.0 fL   MCH 31.0  26.0 - 34.0 pg   MCHC 34.7  30.0 - 36.0 g/dL   RDW 86.5  78.4 - 69.6 %   Platelets 237  150 - 400 K/uL   Neutrophils Relative 90 (*) 43 - 77 %   Neutro Abs 9.5 (*) 1.7 - 7.7 K/uL   Lymphocytes Relative 6 (*) 12 - 46 %   Lymphs Abs 0.6 (*) 0.7 - 4.0 K/uL   Monocytes Relative 4  3 - 12 %   Monocytes Absolute 0.4  0.1 - 1.0 K/uL   Eosinophils Relative 0  0 - 5 %   Eosinophils Absolute 0.0  0.0 - 0.7 K/uL   Basophils Relative 0  0 - 1 %   Basophils Absolute 0.0  0.0 - 0.1 K/uL  COMPREHENSIVE METABOLIC  PANEL      Component Value Range   Sodium 126 (*) 135 - 145 mEq/L   Potassium 3.3 (*) 3.5 - 5.1 mEq/L   Chloride 90 (*) 96 - 112 mEq/L   CO2 24  19 - 32 mEq/L   Glucose, Bld 118 (*) 70 - 99 mg/dL   BUN 9  6 - 23 mg/dL   Creatinine, Ser 2.95  0.50 - 1.10 mg/dL   Calcium 9.3  8.4 - 28.4 mg/dL   Total Protein 6.9  6.0 - 8.3 g/dL   Albumin 3.6  3.5 - 5.2 g/dL   AST 19  0 - 37 U/L   ALT 28  0 - 35 U/L   Alkaline Phosphatase 90  39 - 117 U/L   Total Bilirubin 1.5 (*) 0.3 - 1.2 mg/dL   GFR calc non Af Amer >90  >90 mL/min   GFR calc Af Amer >90  >90 mL/min  LIPASE, BLOOD      Component Value Range   Lipase 124 (*) 11 - 59 U/L  URINALYSIS, ROUTINE W REFLEX MICROSCOPIC      Component Value Range   Color, Urine YELLOW  YELLOW   APPearance CLEAR  CLEAR   Specific Gravity, Urine 1.011  1.005 - 1.030   pH 5.5  5.0 - 8.0   Glucose, UA NEGATIVE  NEGATIVE mg/dL   Hgb urine dipstick TRACE (*) NEGATIVE   Bilirubin Urine NEGATIVE  NEGATIVE   Ketones, ur 15 (*) NEGATIVE mg/dL   Protein, ur NEGATIVE  NEGATIVE mg/dL   Urobilinogen, UA 0.2  0.0 - 1.0 mg/dL   Nitrite NEGATIVE  NEGATIVE   Leukocytes, UA NEGATIVE  NEGATIVE  URINE MICROSCOPIC-ADD ON      Component Value Range   Squamous Epithelial / LPF RARE  RARE   WBC, UA 3-6  <3 WBC/hpf   RBC / HPF 0-2  <3 RBC/hpf   Bacteria, UA FEW (*) RARE       Ct Abdomen Pelvis W Contrast  04/14/2012  *RADIOLOGY REPORT*  Clinical Data: Fever, abdominal pain, nausea, vomiting.  CT ABDOMEN AND PELVIS WITH CONTRAST  Technique:  Multidetector CT imaging of the abdomen and pelvis was performed following the standard protocol during bolus administration of intravenous contrast.  Contrast: OMNIPAQUE IOHEXOL 300 MG/ML  SOLN  Comparison: 06/21/2010  Findings: Dependent and bibasilar atelectasis.  Heart is normal size.  There is significant enlargement of the appendix which measures up to 15 mm in diameter.  Extensive surrounding inflammatory change. Findings  compatible with acute  appendicitis.  Appendicolith at the base of the appendix.  The terminal ileum is thickened, likely secondarily inflamed.  Base of the cecum also inflamed and thickened.  Extensive mesenteric adenopathy again noted.  This appears slightly progressed since prior study.  Mild edema within the mesentery, similar to prior study.  Liver, gallbladder, spleen, pancreas, adrenals and kidneys are normal.  Stomach, large and small bowel grossly unremarkable.  Small amount of free fluid in the pelvis.  No free air.  Uterus, adnexa and urinary bladder are unremarkable.  No acute bony abnormality.  IMPRESSION: Dilated, inflamed appendix with appendicolith at the base of the appendix.  Findings compatible with acute appendicitis.  Slightly progressive mesenteric lymphadenopathy with mesenteric edema.  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  Small amount of free fluid in the pelvis.  Original Report Authenticated By: Cyndie Chime, M.D.     1. Appendicitis   2. Hyponatremia       MDM  9:40 PM patient seen and evaluated. Patient appears uncomfortable. No acute distress. Patient afebrile here.  Patient having some improvement of pain and nausea symptoms.  Pt seen and evaluated by Attending Physician.  Agrees with assessment and plan.  CT shows signs for acute appendicitis. Will consult general surgery.  Dr. Ezzard Standing with general surgery was consulted and he will come see patient.    Angus Seller, [place] 04/14/12 [phone]

## 2012-04-13 NOTE — ED Notes (Signed)
Pt reports pain with palpation of right upper and lower abdomen. Pt reports pain worse with palpation than with release of pressure. Pt reports mild pain with palpation to left lower abdomen and little to no pain in left upper abdomen.  No visible swelling or redness. Bowel sounds present in all 4 quadrants.

## 2012-04-14 ENCOUNTER — Encounter (HOSPITAL_COMMUNITY): Admission: EM | Disposition: A | Discharge status: Home / Self Care | Payer: Self-pay | Source: Home / Self Care

## 2012-04-14 ENCOUNTER — Encounter (HOSPITAL_COMMUNITY): Payer: Self-pay | Admitting: Registered Nurse

## 2012-04-14 ENCOUNTER — Emergency Department (HOSPITAL_COMMUNITY): Payer: BC Managed Care – PPO | Admitting: Registered Nurse

## 2012-04-14 DIAGNOSIS — K3580 Unspecified acute appendicitis: Secondary | ICD-10-CM

## 2012-04-14 HISTORY — PX: LAPAROSCOPIC APPENDECTOMY: SHX408

## 2012-04-14 SURGERY — APPENDECTOMY, LAPAROSCOPIC
Anesthesia: General | Site: Abdomen | Wound class: Dirty or Infected

## 2012-04-14 MED ORDER — PROMETHAZINE HCL 25 MG/ML IJ SOLN
25.0000 mg | Freq: Four times a day (QID) | INTRAMUSCULAR | Status: DC | PRN
  Administered 2012-04-14 – 2012-04-20 (×4): 25 mg via INTRAVENOUS
  Filled 2012-04-14 (×5): qty 1

## 2012-04-14 MED ORDER — LACTATED RINGERS IV SOLN
INTRAVENOUS | Status: DC | PRN
  Administered 2012-04-14: 02:00:00 via INTRAVENOUS

## 2012-04-14 MED ORDER — FLUCONAZOLE 200 MG PO TABS
200.0000 mg | ORAL_TABLET | Freq: Once | ORAL | Status: AC
  Administered 2012-04-14: 200 mg via ORAL
  Filled 2012-04-14: qty 1

## 2012-04-14 MED ORDER — DEXTROSE 5 % IV SOLN
2.0000 g | INTRAVENOUS | Status: DC | PRN
  Administered 2012-04-14: 2 g via INTRAVENOUS

## 2012-04-14 MED ORDER — MORPHINE SULFATE 2 MG/ML IJ SOLN
1.0000 mg | INTRAMUSCULAR | Status: DC | PRN
Start: 2012-04-14 — End: 2012-04-22
  Administered 2012-04-14 – 2012-04-18 (×7): 2 mg via INTRAVENOUS
  Filled 2012-04-14 (×7): qty 1

## 2012-04-14 MED ORDER — BUPIVACAINE HCL (PF) 0.25 % IJ SOLN
INTRAMUSCULAR | Status: DC | PRN
  Administered 2012-04-14: 30 mL

## 2012-04-14 MED ORDER — GLYCOPYRROLATE 0.2 MG/ML IJ SOLN
INTRAMUSCULAR | Status: DC | PRN
  Administered 2012-04-14: .8 mg via INTRAVENOUS

## 2012-04-14 MED ORDER — LACTATED RINGERS IR SOLN
Status: DC | PRN
  Administered 2012-04-14: 3000 mL

## 2012-04-14 MED ORDER — LORAZEPAM 1 MG PO TABS
1.0000 mg | ORAL_TABLET | Freq: Every evening | ORAL | Status: DC | PRN
  Administered 2012-04-14 – 2012-04-21 (×8): 1 mg via ORAL
  Filled 2012-04-14 (×8): qty 1

## 2012-04-14 MED ORDER — LACTATED RINGERS IV SOLN: INTRAVENOUS | Status: DC

## 2012-04-14 MED ORDER — PROPOFOL 10 MG/ML IV EMUL
INTRAVENOUS | Status: DC | PRN
  Administered 2012-04-14: 200 mg via INTRAVENOUS

## 2012-04-14 MED ORDER — DEXTROSE 5 % IV SOLN
1.0000 g | Freq: Four times a day (QID) | INTRAVENOUS | Status: DC
  Administered 2012-04-14 – 2012-04-18 (×17): 1 g via INTRAVENOUS
  Filled 2012-04-14 (×18): qty 1

## 2012-04-14 MED ORDER — ROCURONIUM BROMIDE 100 MG/10ML IV SOLN
INTRAVENOUS | Status: DC | PRN
  Administered 2012-04-14: 30 mg via INTRAVENOUS
  Administered 2012-04-14: 5 mg via INTRAVENOUS

## 2012-04-14 MED ORDER — BUPIVACAINE HCL (PF) 0.25 % IJ SOLN
INTRAMUSCULAR | Status: AC
  Filled 2012-04-14: qty 30

## 2012-04-14 MED ORDER — ONDANSETRON HCL 4 MG/2ML IJ SOLN
INTRAMUSCULAR | Status: DC | PRN
  Administered 2012-04-14: 4 mg via INTRAVENOUS

## 2012-04-14 MED ORDER — ONDANSETRON HCL 4 MG/2ML IJ SOLN
INTRAMUSCULAR | Status: AC
  Filled 2012-04-14: qty 2

## 2012-04-14 MED ORDER — KCL IN DEXTROSE-NACL 20-5-0.45 MEQ/L-%-% IV SOLN
INTRAVENOUS | Status: DC
  Administered 2012-04-14 – 2012-04-17 (×6): via INTRAVENOUS
  Administered 2012-04-17: 100 mL/h via INTRAVENOUS
  Administered 2012-04-18 – 2012-04-20 (×4): via INTRAVENOUS
  Filled 2012-04-14 (×16): qty 1000

## 2012-04-14 MED ORDER — HYDROCODONE-ACETAMINOPHEN 5-325 MG PO TABS
1.0000 | ORAL_TABLET | ORAL | Status: DC | PRN
  Administered 2012-04-14 (×2): 1 via ORAL
  Administered 2012-04-14 – 2012-04-16 (×6): 2 via ORAL
  Administered 2012-04-16: 1 via ORAL
  Administered 2012-04-16 – 2012-04-17 (×3): 2 via ORAL
  Administered 2012-04-17: 1 via ORAL
  Administered 2012-04-18: 2 via ORAL
  Administered 2012-04-18 (×2): 1 via ORAL
  Administered 2012-04-18 – 2012-04-19 (×3): 2 via ORAL
  Administered 2012-04-19: 1 via ORAL
  Administered 2012-04-20 (×3): 2 via ORAL
  Administered 2012-04-21: 1 via ORAL
  Administered 2012-04-21: 2 via ORAL
  Administered 2012-04-21 (×2): 1 via ORAL
  Administered 2012-04-21 – 2012-04-22 (×3): 2 via ORAL
  Filled 2012-04-14 (×7): qty 2
  Filled 2012-04-14 (×4): qty 1
  Filled 2012-04-14 (×7): qty 2
  Filled 2012-04-14: qty 1
  Filled 2012-04-14 (×3): qty 2
  Filled 2012-04-14: qty 1
  Filled 2012-04-14: qty 2
  Filled 2012-04-14 (×3): qty 1
  Filled 2012-04-14 (×5): qty 2

## 2012-04-14 MED ORDER — LACTATED RINGERS IR SOLN
Status: DC | PRN
  Administered 2012-04-14: 1000 mL

## 2012-04-14 MED ORDER — ENOXAPARIN SODIUM 40 MG/0.4ML ~~LOC~~ SOLN
40.0000 mg | SUBCUTANEOUS | Status: DC
  Administered 2012-04-14 – 2012-04-21 (×7): 40 mg via SUBCUTANEOUS
  Filled 2012-04-14 (×9): qty 0.4

## 2012-04-14 MED ORDER — LACTATED RINGERS IV BOLUS (SEPSIS)
1000.0000 mL | Freq: Once | INTRAVENOUS | Status: AC
  Administered 2012-04-14: 1000 mL via INTRAVENOUS

## 2012-04-14 MED ORDER — NEOSTIGMINE METHYLSULFATE 1 MG/ML IJ SOLN
INTRAMUSCULAR | Status: DC | PRN
  Administered 2012-04-14: 5 mg via INTRAVENOUS

## 2012-04-14 MED ORDER — IOHEXOL 300 MG/ML  SOLN
100.0000 mL | Freq: Once | INTRAMUSCULAR | Status: AC | PRN
  Administered 2012-04-14: 100 mL via INTRAVENOUS

## 2012-04-14 MED ORDER — PROMETHAZINE HCL 25 MG/ML IJ SOLN: 6.2500 mg | INTRAMUSCULAR | Status: DC | PRN

## 2012-04-14 MED ORDER — ONDANSETRON HCL 4 MG PO TABS
4.0000 mg | ORAL_TABLET | Freq: Four times a day (QID) | ORAL | Status: DC | PRN
  Administered 2012-04-19 – 2012-04-22 (×3): 4 mg via ORAL
  Filled 2012-04-14 (×3): qty 1

## 2012-04-14 MED ORDER — ACETAMINOPHEN 325 MG PO TABS
650.0000 mg | ORAL_TABLET | ORAL | Status: DC | PRN
  Administered 2012-04-16 – 2012-04-19 (×2): 650 mg via ORAL
  Filled 2012-04-14 (×2): qty 2

## 2012-04-14 MED ORDER — CEFOXITIN SODIUM-DEXTROSE 1-4 GM-% IV SOLR (PREMIX)
INTRAVENOUS | Status: AC
  Filled 2012-04-14: qty 100

## 2012-04-14 MED ORDER — ONDANSETRON HCL 4 MG/2ML IJ SOLN
4.0000 mg | Freq: Once | INTRAMUSCULAR | Status: AC
  Administered 2012-04-14: 4 mg via INTRAVENOUS
  Filled 2012-04-14: qty 2

## 2012-04-14 MED ORDER — DEXAMETHASONE SODIUM PHOSPHATE 10 MG/ML IJ SOLN
INTRAMUSCULAR | Status: DC | PRN
  Administered 2012-04-14: 10 mg via INTRAVENOUS

## 2012-04-14 MED ORDER — SUCCINYLCHOLINE CHLORIDE 20 MG/ML IJ SOLN
INTRAMUSCULAR | Status: DC | PRN
  Administered 2012-04-14: 100 mg via INTRAVENOUS

## 2012-04-14 MED ORDER — HYDROMORPHONE HCL PF 1 MG/ML IJ SOLN: 0.2500 mg | INTRAMUSCULAR | Status: DC | PRN

## 2012-04-14 MED ORDER — ONDANSETRON HCL 4 MG/2ML IJ SOLN
4.0000 mg | Freq: Four times a day (QID) | INTRAMUSCULAR | Status: DC | PRN
  Administered 2012-04-16 – 2012-04-20 (×8): 4 mg via INTRAVENOUS
  Filled 2012-04-14 (×8): qty 2

## 2012-04-14 MED ORDER — FENTANYL CITRATE 0.05 MG/ML IJ SOLN
INTRAMUSCULAR | Status: DC | PRN
  Administered 2012-04-14 (×2): 50 ug via INTRAVENOUS

## 2012-04-14 MED ORDER — LIDOCAINE HCL (CARDIAC) 20 MG/ML IV SOLN
INTRAVENOUS | Status: DC | PRN
  Administered 2012-04-14: 80 mg via INTRAVENOUS

## 2012-04-14 SURGICAL SUPPLY — 36 items
APPLIER CLIP ROT 10 11.4 M/L (STAPLE)
BENZOIN TINCTURE PRP APPL 2/3 (GAUZE/BANDAGES/DRESSINGS) ×2 IMPLANT
CANISTER SUCTION 2500CC (MISCELLANEOUS) ×4 IMPLANT
CLIP APPLIE ROT 10 11.4 M/L (STAPLE) IMPLANT
CLOTH BEACON ORANGE TIMEOUT ST (SAFETY) ×2 IMPLANT
CUTTER FLEX LINEAR 45M (STAPLE) ×2 IMPLANT
DECANTER SPIKE VIAL GLASS SM (MISCELLANEOUS) IMPLANT
DERMABOND ADVANCED (GAUZE/BANDAGES/DRESSINGS) ×1
DERMABOND ADVANCED .7 DNX12 (GAUZE/BANDAGES/DRESSINGS) ×1 IMPLANT
DRAPE LAPAROSCOPIC ABDOMINAL (DRAPES) ×2 IMPLANT
ELECT REM PT RETURN 9FT ADLT (ELECTROSURGICAL) ×2
ELECTRODE REM PT RTRN 9FT ADLT (ELECTROSURGICAL) ×1 IMPLANT
ENDOLOOP SUT PDS II  0 18 (SUTURE)
ENDOLOOP SUT PDS II 0 18 (SUTURE) IMPLANT
GLOVE BIOGEL PI IND STRL 7.0 (GLOVE) ×1 IMPLANT
GLOVE BIOGEL PI INDICATOR 7.0 (GLOVE) ×1
GLOVE SURG SIGNA 7.5 PF LTX (GLOVE) ×2 IMPLANT
GOWN STRL NON-REIN LRG LVL3 (GOWN DISPOSABLE) ×2 IMPLANT
GOWN STRL REIN XL XLG (GOWN DISPOSABLE) ×4 IMPLANT
KIT BASIN OR (CUSTOM PROCEDURE TRAY) ×2 IMPLANT
PENCIL BUTTON HOLSTER BLD 10FT (ELECTRODE) IMPLANT
POUCH SPECIMEN RETRIEVAL 10MM (ENDOMECHANICALS) ×2 IMPLANT
RELOAD 45 VASCULAR/THIN (ENDOMECHANICALS) IMPLANT
RELOAD STAPLE TA45 3.5 REG BLU (ENDOMECHANICALS) ×2 IMPLANT
SCALPEL HARMONIC ACE (MISCELLANEOUS) ×2 IMPLANT
SET IRRIG TUBING LAPAROSCOPIC (IRRIGATION / IRRIGATOR) ×2 IMPLANT
SOLUTION ANTI FOG 6CC (MISCELLANEOUS) ×2 IMPLANT
STRIP CLOSURE SKIN 1/4X3 (GAUZE/BANDAGES/DRESSINGS) ×2 IMPLANT
SUT MON AB 5-0 PS2 18 (SUTURE) ×2 IMPLANT
TOWEL OR 17X26 10 PK STRL BLUE (TOWEL DISPOSABLE) ×2 IMPLANT
TRAY FOLEY CATH 14FRSI W/METER (CATHETERS) ×2 IMPLANT
TRAY LAP CHOLE (CUSTOM PROCEDURE TRAY) ×2 IMPLANT
TROCAR XCEL BLUNT TIP 100MML (ENDOMECHANICALS) ×2 IMPLANT
TROCAR Z-THREAD FIOS 11X100 BL (TROCAR) ×2 IMPLANT
TROCAR Z-THREAD FIOS 5X100MM (TROCAR) ×2 IMPLANT
TUBING INSUFFLATION 10FT LAP (TUBING) ×2 IMPLANT

## 2012-04-14 NOTE — Transfer of Care (Signed)
Immediate Anesthesia Transfer of Care Note  Patient: Tracy Bradley  Procedure(s) Performed: Procedure(s) (LRB): APPENDECTOMY LAPAROSCOPIC (N/A)  Patient Location: PACU  Anesthesia Type: General  Level of Consciousness: awake, alert , oriented and patient cooperative  Airway & Oxygen Therapy: Patient Spontanous Breathing and Patient connected to face mask oxygen  Post-op Assessment: Report given to PACU RN, Post -op Vital signs reviewed and stable and Patient moving all extremities  Post vital signs: Reviewed and stable  Complications: No apparent anesthesia complications

## 2012-04-14 NOTE — Progress Notes (Signed)
Pt c/o "yeast infection" Dr. Michaell Cowing notified and new order received for one time dose of fluconazole PO. Tracy Bradley Ann[place]ess 8:37 PM

## 2012-04-14 NOTE — ED Notes (Signed)
MD at bedside.  Dr. Ezzard Standing surgical here to evaluate this pt

## 2012-04-14 NOTE — Preoperative (Signed)
Beta Blockers   Reason not to administer Beta Blockers:Not Applicable 

## 2012-04-14 NOTE — H&P (Addendum)
Re:   Tracy Bradley DOB:   1955/09/13 MRN:   [id number]  ASSESSMENT AND PLAN: 1.  Appendicitis  I discussed with the patient the indications and risks of appendiceal surgery.  The primary risks of appendiceal surgery include, but are not limited to, bleeding, infection, bowel surgery, and open surgery.  There is also the risk that the patient may have continued symptoms after surgery.    However, the likelihood of improvement in symptoms and return to the patient's normal status is good. We discussed the typical post-operative recovery course. I tried to answer the patient's questions.  2.  Obese 3.  Mesenteric adenopathy on CT scan,   History of adenopathy worked up by Dr. Leonard Schwartz. [place]rill approx 2011 - negative.  Will copy CT scan to patient for f/u. 4.  History of kidney stones.   Chief Complaint  Patient presents with  . Fever  . Abdominal Pain   REFERRING PHYSICIAN: TODD,JEFFREY ALLEN, MD  HISTORY OF PRESENT ILLNESS: Tracy Bradley is a 56 y.o. (DOB: Mar 25, 1956)  white female whose primary care physician is TODD,JEFFREY ALLEN, MD and comes to the [facility] ER with 2 days of worsening abdominal pain.  She developed nausea and dry heaves on 04/11/2012.  She had a fever on Friday, 8/16, and then by Sat., 8/17 had worsening abdominal pain.  She came on to the [facility] ER.  No history of stomach disease.  No history of liver disease.  No history of gall bladder disease.  No history of pancreas disease.  No history of colon disease or colonoscopy.   She had a TL in 1998.  Her mother had appendicitis a couple of years ago.  She was evaluated about 2 years ago for abdominal adenopathy (this was discovered when she had a CT scan for her kidney stone) by Dr. Leonard Schwartz. [place], but nothing came of this.  CT scan - 04/13/2012 - acute appendicitis, mesenteric lymphadenopathy concerning for lymphoma   History reviewed. No pertinent past medical history.    Past Surgical History  Procedure Date  . Back surgery   .  Tonsillectomy       Current Facility-Administered Medications  Medication Dose Route Frequency Provider Last Rate Last Dose  . iohexol (OMNIPAQUE) 300 MG/ML solution 100 mL  100 mL Intravenous Once PRN Medication Radiologist, MD      . iohexol (OMNIPAQUE) 300 MG/ML solution 100 mL  100 mL Intravenous Once PRN Medication Radiologist, MD   100 mL at 04/14/12 0012  . lactated ringers bolus 1,000 mL  1,000 mL Intravenous Once Derwood Kaplan, MD      . morphine 4 MG/ML injection 4 mg  4 mg Intravenous Once Angus Seller, PA   4 mg at 04/13/12 2204  . morphine 4 MG/ML injection 4 mg  4 mg Intravenous Once Angus Seller, PA   4 mg at 04/13/12 2357  . ondansetron (ZOFRAN) injection 4 mg  4 mg Intravenous Once Angus Seller, PA   4 mg at 04/13/12 2202  . ondansetron (ZOFRAN) injection 4 mg  4 mg Intravenous Once Angus Seller, PA   4 mg at 04/14/12 0044  . sodium chloride 0.9 % bolus 1,000 mL  1,000 mL Intravenous Once Angus Seller, PA   1,000 mL at 04/13/12 2201   Current Outpatient Prescriptions  Medication Sig Dispense Refill  . LORazepam (ATIVAN) 1 MG tablet Take 1 mg by mouth at bedtime.         No Known  Allergies  REVIEW OF SYSTEMS: Skin:  No history of rash.  No history of abnormal moles. Infection:  No history of hepatitis or HIV.  No history of MRSA. Neurologic:  No history of stroke.  No history of seizure.  No history of headaches. Cardiac:  No history of hypertension. No history of heart disease.  No history of seeing a cardiologist. Pulmonary:  Does not smoke cigarettes.  No asthma or bronchitis.  No OSA/CPAP.  Endocrine:  No diabetes. No thyroid disease. Gastrointestinal:  See HPI. Urologic:  Kidney stone in 2011. Musculoskeletal:  No history of joint or back disease. Hematologic:  Prior hx of lymphadenopathy. Psycho-social:  The patient is oriented.   The patient has no obvious psychologic or social impairment to understanding our conversation and plan.  She takes  lorazepam for sleep.  SOCIAL and FAMILY HISTORY: Married. Husband with her. She does not work.  PHYSICAL EXAM: BP 101/57  Pulse 92  Temp 99.1 F (37.3 C) (Oral)  Resp 20  Ht 5\' 8"  (1.727 m)  Wt 200 lb (90.719 kg)  BMI 30.41 kg/m2  SpO2 96%  General: WN WF who is alert and generally healthy appearing.  She is mildly obese. HEENT: Normal. Pupils equal. Neck: Supple. No mass.  No thyroid mass. Lymph Nodes:  No supraclavicular or cervical nodes. Lungs: Clear to auscultation and symmetric breath sounds. Heart:  RRR. No murmur or rub.  Abdomen: Soft. No mass. Tender with guarding in the RLQ.  TL scar below umbilicus. Rectal: Not done. Extremities:  Good strength and ROM  in upper and lower extremities. Neurologic:  Grossly intact to motor and sensory function. Psychiatric: Has normal mood and affect. Behavior is normal.   DATA REVIEWED: WBC - 10, 500 - 04/13/2012 K+ - 3.3 - 04/13/2012  Ovidio Kin, MD,  [facility] [address] [place]  [address]   [place], [place]    [address] Phone:  [phone] FAX:  [phone]

## 2012-04-14 NOTE — Anesthesia Preprocedure Evaluation (Addendum)
Anesthesia Evaluation  Patient identified by MRN, date of birth, ID band Patient awake    Reviewed: Allergy & Precautions, H&P , NPO status , Patient's Chart, lab work & pertinent test results  Airway Mallampati: II TM Distance: >3 FB Neck ROM: Full    Dental No notable dental hx.    Pulmonary shortness of breath,  breath sounds clear to auscultation  Pulmonary exam normal       Cardiovascular negative cardio ROS  Rhythm:Regular Rate:Normal     Neuro/Psych negative neurological ROS  negative psych ROS   GI/Hepatic negative GI ROS, Neg liver ROS,   Endo/Other  negative endocrine ROS  Renal/GU negative Renal ROS  negative genitourinary   Musculoskeletal negative musculoskeletal ROS (+)   Abdominal (+) + obese,   Peds negative pediatric ROS (+)  Hematology negative hematology ROS (+)   Anesthesia Other Findings   Reproductive/Obstetrics negative OB ROS                           Anesthesia Physical Anesthesia Plan  ASA: II and Emergent  Anesthesia Plan: General   Post-op Pain Management:    Induction: Intravenous  Airway Management Planned:   Additional Equipment:   Intra-op Plan:   Post-operative Plan: Extubation in OR  Informed Consent: I have reviewed the patients History and Physical, chart, labs and discussed the procedure including the risks, benefits and alternatives for the proposed anesthesia with the patient or authorized representative who has indicated his/her understanding and acceptance.   Dental advisory given  Plan Discussed with: CRNA  Anesthesia Plan Comments:         Anesthesia Quick Evaluation

## 2012-04-14 NOTE — ED Provider Notes (Signed)
Medical screening examination/treatment/procedure(s) were conducted as a shared visit with non-physician practitioner(s) and myself.  I personally evaluated the patient during the encounter.  Pt comes in with worsening abd pain. Diffuse pain, but worst on the right side. Pt has some nausea, anorexia. Pt has + guarding on my exam. CT shows appendicitis. Surgery notified, and they will see the patient. NPO, iv hydration, pain control in the ED. Pt has no significan medical hx.  Derwood Kaplan, MD 04/14/12 [phone]

## 2012-04-14 NOTE — Anesthesia Postprocedure Evaluation (Signed)
  Anesthesia Post-op Note  Patient: Tracy Bradley  Procedure(s) Performed: Procedure(s) (LRB): APPENDECTOMY LAPAROSCOPIC (N/A)  Patient Location: PACU  Anesthesia Type: General  Level of Consciousness: awake and alert   Airway and Oxygen Therapy: Patient Spontanous Breathing  Post-op Pain: mild  Post-op Assessment: Post-op Vital signs reviewed, Patient's Cardiovascular Status Stable, Respiratory Function Stable, Patent Airway and No signs of Nausea or vomiting  Post-op Vital Signs: stable  Complications: No apparent anesthesia complications

## 2012-04-14 NOTE — Progress Notes (Signed)
AM of surgery  Nauseated earlier, better now. Has been out of bed and walked. On clear liquids.  Would advance slowly. Wounds look okay. Reviewed surgical findings with the patient.  Husband in room.  Ovidio Kin, MD, [facility] Pager: [phone] Office phone:  [phone]

## 2012-04-14 NOTE — ED Notes (Signed)
Patient taken for CT

## 2012-04-14 NOTE — Op Note (Addendum)
Re:   Tracy Bradley DOB:   08-31-1955 MRN:   [id number]                   FACILITY:  [facility]  DATE OF PROCEDURE: 04/14/2012                              OPERATIVE REPORT  PREOPERATIVE DIAGNOSIS:  Appendicitis  POSTOPERATIVE DIAGNOSIS:  Acute suppurative appendicitis, ruptured.  PROCEDURE:  Laparoscopic appendectomy.  SURGEON:  Sandria Bales. Ezzard Standing, MD  ASSISTANT:  No first assistant.  ANESTHESIA:  General endotracheal.  Azell Der, MD - Anesthesiologist Elisabeth Cara, CRNA - CRNA  General  ESTIMATED BLOOD LOSS:  Minimal.  DRAINS: none   SPECIMEN:   Appendix  COUNTS CORRECT:  YES  INDICATIONS FOR PROCEDURE:  Tracy Bradley is a 56 y.o. (DOB: [birth year]) white female whose primary care doctor is TODD,JEFFREY ALLEN, MD and comes to the OR for an appendectomy.   I discussed with the patient, the indications and potential complications of appendiceal surgery.  The potential complications include, but are not limited to, bleeding, open surgery, bowel resection, and the possibility of another diagnosis.  OPERATIVE NOTE:  The patient underwent a general endotracheal anesthetic as supervised by Azell Der, MD - Anesthesiologist,  Elisabeth Cara, CRNA - CRNA, in room #1.  The patient was given 2 g of cefoxitin at the beginning of the procedure and the abdomen was prepped with ChloraPrep.  A time-out was held and surgical checklist run.  An infraumbilical incision was made with sharp dissection carried down to the abdominal cavity.  I went through an old TL incision.  A 0 degree 10 mm laparoscope was inserted through a 12 mm Hasson trocar and Hasson trocar secured with a 0 Vicryl suture.  I placed a 5 mm trocar in the right upper quadrant and 11 mm in left lower quadrant and did abdominal exploration.    The right and left lobes of liver unremarkable.  Stomach was unremarkable..  I saw no other intra-abdominal abnormality.  The patient had appendicitis with the appendix located  retro-cecal.  The appendix had ruptured with gross purulence in the RLQ, matted omentum and small bowel with purulent exudate, and RLQ intra-loop abscesses.  She also had purulence behind the uterus and in the pelvis.  Note:  Her pre op CT scan suggested that she had mesenteric lymphadenopathy, but because of the purulence and inflammation, I could not assess the bowel mesentery well.  But there was no gross evidence of intra-peritoneal tumor.  The appendix was bound up along the posterior lateral cecum.  It had ruptured near the base.  The mesentery of the appendix was divided with a Harmonic scalpel.  I got to the base of the appendix.  I then used a blue load 45 mm Ethicon Endo-GIA stapler and fired this across the base of the appendix.  I placed the appendix in EndoCatch bag and delivered the bag through the umbilical incision.  I irrigated the abdomen with 4 liters of fluid and tried to irrigate and break up the different collections.  After irrigating the abdomen, I then removed the trocars, in turn.  The umbilical port fascia was closed with 0 Vicryl suture.   I closed the skin each site with a 5-0 Vicryl suture and painted the wounds with Dermabond.  I then injected a total of 30 mL of 0.25% Marcaine at the  incisions.  Sponge and needle count were correct at the end of the case.  The patient was transferred to the recovery room in good condition.  The patient tolerated the procedure well and it depends on the patient's post op clinical course as to when she could be discharged.  But I expect because of the contamination, her hospital course will be longer.   Ovidio Kin, MD, [facility] Pager: [phone] Office phone:  [phone]

## 2012-04-15 ENCOUNTER — Encounter (HOSPITAL_COMMUNITY): Payer: Self-pay | Admitting: Surgery

## 2012-04-15 LAB — BASIC METABOLIC PANEL
BUN: 8 mg/dL (ref 6–23)
CO2: 26 mEq/L (ref 19–32)
Calcium: 8.6 mg/dL (ref 8.4–10.5)
Chloride: 103 mEq/L (ref 96–112)
Creatinine, Ser: 0.72 mg/dL (ref 0.50–1.10)
GFR calc Af Amer: >90 mL/min (ref >90)
GFR calc non Af Amer: >90 mL/min (ref >90)
Glucose, Bld: 141 mg/dL — ABNORMAL HIGH (ref 70–99)
Potassium: 4.3 mEq/L (ref 3.5–5.1)
Sodium: 135 mEq/L (ref 135–145)

## 2012-04-15 LAB — CBC WITH DIFFERENTIAL/PLATELET
Basophils Absolute: 0 10*3/uL (ref 0.0–0.1)
Lymphocytes Relative: 6 % — ABNORMAL LOW (ref 12–46)
Neutro Abs: 9 10*3/uL — ABNORMAL HIGH (ref 1.7–7.7)
Platelets: 217 10*3/uL (ref 150–400)
RDW: 12.9 % (ref 11.5–15.5)
WBC: 10.1 10*3/uL (ref 4.0–10.5)

## 2012-04-15 MED ORDER — BISACODYL 10 MG RE SUPP
10.0000 mg | Freq: Every day | RECTAL | Status: DC | PRN
  Administered 2012-04-15: 10 mg via RECTAL
  Filled 2012-04-15 (×2): qty 1

## 2012-04-15 NOTE — Progress Notes (Signed)
General Surgery [facility]  Patient seen and examined.  Advance to full liquid diet.  Ambulating in halls. Velora Heckler, MD, [facility] Office: [phone]

## 2012-04-15 NOTE — Progress Notes (Signed)
Patient ID: Tracy Bradley, female   DOB: 1955/12/05, 56 y.o.   MRN: [id number] 1 Day Post-Op  Subjective: Pt feels better this morning.  Mostly sore.  Passed a little flatus last night.  Tolerating clears with no nausea.  Objective: Vital signs in last 24 hours: Temp:  [97.8 F (36.6 C)-98.6 F (37 C)] 98.1 F (36.7 C) (08/19 0455) Pulse Rate:  [65-83] 78  (08/19 0455) Resp:  [16-18] 16  (08/19 0455) BP: (86-123)/(50-80) 91/55 mmHg (08/19 0455) SpO2:  [91 %-95 %] 95 % (08/19 0455) Last BM Date: 04/12/12  Intake/Output from previous day:   Intake/Output this shift:    PE: Abd: soft, +BS, obese, tender appropriately, incisions are c/d/i  Lab Results:   Basename 04/15/12 0423 04/13/12 2125  WBC 10.1 10.5  HGB 9.7* 12.5  HCT 29.1* 36.0  PLT 217 237   BMET  Basename 04/15/12 0423 04/13/12 2125  NA 135 126*  K 4.3 3.3*  CL 103 90*  CO2 26 24  GLUCOSE 141* 118*  BUN 8 9  CREATININE 0.72 0.71  CALCIUM 8.6 9.3   PT/INR No results found for this basename: LABPROT:2,INR:2 in the last 72 hours CMP     Component Value Date/Time   NA 135 04/15/2012 0423   K 4.3 04/15/2012 0423   CL 103 04/15/2012 0423   CO2 26 04/15/2012 0423   GLUCOSE 141* 04/15/2012 0423   BUN 8 04/15/2012 0423   CREATININE 0.72 04/15/2012 0423   CALCIUM 8.6 04/15/2012 0423   PROT 6.9 04/13/2012 2125   ALBUMIN 3.6 04/13/2012 2125   AST 19 04/13/2012 2125   ALT 28 04/13/2012 2125   ALKPHOS 90 04/13/2012 2125   BILITOT 1.5* 04/13/2012 2125   GFRNONAA >90 04/15/2012 0423   GFRAA >90 04/15/2012 0423   Lipase     Component Value Date/Time   LIPASE 124* 04/13/2012 2125       Studies/Results: Ct Abdomen Pelvis W Contrast  04/14/2012  *RADIOLOGY REPORT*  Clinical Data: Fever, abdominal pain, nausea, vomiting.  CT ABDOMEN AND PELVIS WITH CONTRAST  Technique:  Multidetector CT imaging of the abdomen and pelvis was performed following the standard protocol during bolus administration of intravenous contrast.   Contrast: OMNIPAQUE IOHEXOL 300 MG/ML  SOLN  Comparison: 06/21/2010  Findings: Dependent and bibasilar atelectasis.  Heart is normal size.  There is significant enlargement of the appendix which measures up to 15 mm in diameter.  Extensive surrounding inflammatory change. Findings compatible with acute appendicitis.  Appendicolith at the base of the appendix.  The terminal ileum is thickened, likely secondarily inflamed.  Base of the cecum also inflamed and thickened.  Extensive mesenteric adenopathy again noted.  This appears slightly progressed since prior study.  Mild edema within the mesentery, similar to prior study.  Liver, gallbladder, spleen, pancreas, adrenals and kidneys are normal.  Stomach, large and small bowel grossly unremarkable.  Small amount of free fluid in the pelvis.  No free air.  Uterus, adnexa and urinary bladder are unremarkable.  No acute bony abnormality.  IMPRESSION: Dilated, inflamed appendix with appendicolith at the base of the appendix.  Findings compatible with acute appendicitis.  Slightly progressive mesenteric lymphadenopathy with mesenteric edema.  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  Small amount of free fluid in the pelvis.  Original Report Authenticated By: Cyndie Chime, M.D.    Anti-infectives: Anti-infectives     Start     Dose/Rate Route Frequency Ordered Stop   04/14/12 2130   fluconazole (  DIFLUCAN) tablet 200 mg        200 mg Oral  Once 04/14/12 2037 04/14/12 2153   04/14/12 0800   cefOXitin (MEFOXIN) 1 g in dextrose 5 % 50 mL IVPB        1 g 100 mL/hr over 30 Minutes Intravenous Every 6 hours 04/14/12 0500             Assessment/Plan  1. S/p lap appy for perf appy  Plan: 1. Cont abx therapy 2. Advance to full liquids 3. Ambulate TID   LOS: 2 days    Sherrice Creekmore E 04/15/2012

## 2012-04-16 MED ORDER — PANTOPRAZOLE SODIUM 40 MG PO TBEC
40.0000 mg | DELAYED_RELEASE_TABLET | Freq: Every day | ORAL | Status: DC
  Administered 2012-04-16 – 2012-04-22 (×7): 40 mg via ORAL
  Filled 2012-04-16 (×7): qty 1

## 2012-04-16 MED ORDER — BISACODYL 10 MG RE SUPP
10.0000 mg | Freq: Once | RECTAL | Status: AC
  Administered 2012-04-16: 10 mg via RECTAL

## 2012-04-16 NOTE — Progress Notes (Signed)
D: 56yo WF pt of [facility] acute ruptured appendicitis and POD#1 Lap Appendectomy with h/o mild obesity, kidney stones, and past back surgery.  On assessment at 1935 pt noted to have abdominal distention, mod tenderness and hypoactive BS. Pt c/o discomfort and no BM since 04/12/12.  Pt also with decreased oxygen saturation and taking short shallow breaths due to abd discomfort.  Pt just received dose of Noro 2 tabs at 1903. A: Encouraged pt to use IS and perform TCDB exercises to increase O2 sats.  Encouraged pt to get OOB and ambulate to relieve abd discomfort. Paged on call MD to discuss measures to relieve constipation. Received orders for PR Dulcolax to aide in relief of constipation.  No po interventions ordered at this time. PR dose given at 2148.  R: As of 2335 pt has had no results with BM, however pt does report increase in flatus after ambulation x3. Will follow up with attending on rounds re: other possible interventions. Will cont to monitor pt status. Marrion Coy RN

## 2012-04-16 NOTE — Progress Notes (Signed)
General Surgery [facility] Patient seen and examined.  Husband at bedside.  Ambulated.  Some pain, nausea.  Clear liquid diet today.  Continue IV abx. Velora Heckler, MD, [facility] Office: [phone]

## 2012-04-16 NOTE — Progress Notes (Signed)
CARE MANAGEMENT NOTE 04/16/2012  Patient:  Tracy Bradley, Tracy Bradley   Account Number:  [id number]  Date Initiated:  04/16/2012  Documentation initiated by:  Colleen Can  Subjective/Objective Assessment:   dx lap appendectomy-perforated     Action/Plan:   CM spoke withj patient. Plans are for patient to return to her home in [place], [place] where spouse and mother will be caregivers. She is able to ambulate in rm. No HH or DME nmeeds assessed at this time   Anticipated DC Date:  04/19/2012   Anticipated DC Plan:  HOME/SELF CARE  In-house referral  NA      DC Planning Services  CM consult           Status of service:  In process, will continue to follow Medicare Important Message given?   (If response is "NO", the following Medicare IM given date fields will be blank)   Comments:  04/16/2012 Raynelle Bring BSN CCM [phone] Pt had vomiting today. Moved back from full liuids to clears today. Con't iv abx and IV  flds

## 2012-04-16 NOTE — Progress Notes (Signed)
Patient ID: Tracy Bradley, female   DOB: 1956/01/21, 56 y.o.   MRN: [id number] 2 Days Post-Op  Subjective: Pt feels bloated today and had some nausea earlier this morning.  No real flatus, just belching.  C/o pain on right side that hurts to breath, feels like she pulled a muscle.  Objective: Vital signs in last 24 hours: Temp:  [98.3 F (36.8 C)-100.1 F (37.8 C)] 98.9 F (37.2 C) (08/20 0500) Pulse Rate:  [83-93] 83  (08/20 0500) Resp:  [16-18] 16  (08/20 0500) BP: (101-114)/(68-73) 101/68 mmHg (08/20 0500) SpO2:  [92 %-94 %] 92 % (08/20 0500) Last BM Date: 04/12/12  Intake/Output from previous day: 08/19 0701 - 08/20 0700 In: 5089.2 [P.O.:660; I.V.:4429.2] Out: 600 [Urine:600] Intake/Output this shift:    PE: Abd: soft, distended, few BS, tender on right side, but appropriate.  Incisions c/d/i Ht: regular Lungs: CTAB  Lab Results:   Basename 04/15/12 0423 04/13/12 2125  WBC 10.1 10.5  HGB 9.7* 12.5  HCT 29.1* 36.0  PLT 217 237   BMET  Basename 04/15/12 0423 04/13/12 2125  NA 135 126*  K 4.3 3.3*  CL 103 90*  CO2 26 24  GLUCOSE 141* 118*  BUN 8 9  CREATININE 0.72 0.71  CALCIUM 8.6 9.3   PT/INR No results found for this basename: LABPROT:2,INR:2 in the last 72 hours CMP     Component Value Date/Time   NA 135 04/15/2012 0423   K 4.3 04/15/2012 0423   CL 103 04/15/2012 0423   CO2 26 04/15/2012 0423   GLUCOSE 141* 04/15/2012 0423   BUN 8 04/15/2012 0423   CREATININE 0.72 04/15/2012 0423   CALCIUM 8.6 04/15/2012 0423   PROT 6.9 04/13/2012 2125   ALBUMIN 3.6 04/13/2012 2125   AST 19 04/13/2012 2125   ALT 28 04/13/2012 2125   ALKPHOS 90 04/13/2012 2125   BILITOT 1.5* 04/13/2012 2125   GFRNONAA >90 04/15/2012 0423   GFRAA >90 04/15/2012 0423   Lipase     Component Value Date/Time   LIPASE 124* 04/13/2012 2125       Studies/Results: No results found.  Anti-infectives: Anti-infectives     Start     Dose/Rate Route Frequency Ordered Stop   04/14/12 2130    fluconazole (DIFLUCAN) tablet 200 mg        200 mg Oral  Once 04/14/12 2037 04/14/12 2153   04/14/12 0800   cefOXitin (MEFOXIN) 1 g in dextrose 5 % 50 mL IVPB        1 g 100 mL/hr over 30 Minutes Intravenous Every 6 hours 04/14/12 0500             Assessment/Plan  1. S/p lap appy for perf appy 2. Post-op ileus  Plan: 1. Patient requests to go back to clear liquids, which I think it probably a good idea.  I think she is distended some and still has a persistent ileus.  If she develops worsening nausea or vomiting then she may need to be NPO. 2. Pt also requests a suppository.   3. Will check labs in the morning and follow new right sided abdominal pain as the patient is at risk for a post op abscess. 4. Cont ambulation.   LOS: 3 days    Deasia Chiu E 04/16/2012

## 2012-04-17 LAB — BASIC METABOLIC PANEL
BUN: 5 mg/dL — ABNORMAL LOW (ref 6–23)
Chloride: 103 mEq/L (ref 96–112)
GFR calc Af Amer: >90 mL/min (ref >90)
Glucose, Bld: 98 mg/dL (ref 70–99)
Potassium: 4 mEq/L (ref 3.5–5.1)

## 2012-04-17 LAB — CBC
HCT: 32.7 % — ABNORMAL LOW (ref 36.0–46.0)
Hemoglobin: 10.8 g/dL — ABNORMAL LOW (ref 12.0–15.0)
RBC: 3.59 MIL/uL — ABNORMAL LOW (ref 3.87–5.11)
WBC: 10.3 10*3/uL (ref 4.0–10.5)

## 2012-04-17 MED ORDER — VANCOMYCIN HCL 1000 MG IV SOLR
1250.0000 mg | Freq: Two times a day (BID) | INTRAVENOUS | Status: DC
  Administered 2012-04-17 – 2012-04-19 (×6): 1250 mg via INTRAVENOUS
  Filled 2012-04-17 (×9): qty 1250

## 2012-04-17 NOTE — Progress Notes (Signed)
Patient ID: Tracy Bradley, female   DOB: 17-Oct-1955, 56 y.o.   MRN: [id number] 3 Days Post-Op  Subjective: Pt still c/o sharp pain that is on her right side that goes up under her rib cage and around to her back.  She also c/o heat on the lower portion of her abd.  No flatus yet.  Some nausea.  Objective: Vital signs in last 24 hours: Temp:  [98.3 F (36.8 C)-99.1 F (37.3 C)] 98.5 F (36.9 C) (08/21 0619) Pulse Rate:  [84-90] 84  (08/21 0619) Resp:  [14-16] 14  (08/21 0619) BP: (96-113)/(62-70) 96/62 mmHg (08/21 0619) SpO2:  [92 %-94 %] 92 % (08/21 0619) Last BM Date: 04/16/12  Intake/Output from previous day: 08/20 0701 - 08/21 0700 In: 1665.3 [P.O.:540; I.V.:1075.3; IV Piggyback:50] Out: -  Intake/Output this shift: Total I/O In: 100 [P.O.:60; I.V.:40] Out: -   PE: Abd: soft, tender appropriately, except quite tender in LLQ, especially near her incision, few BS, LLQ with large area of cellulitis over LLQ incision extending to her midline and tracking down towards her pubic region as well as tracking around towards her back.  This is erythematous and warm.  She also has some edema of this area, specifically out near her flank extending towards her back.  No crepitance.  All other incisions are c/d/i with no evidence of infection.  Lab Results:   Basename 04/17/12 0410 04/15/12 0423  WBC 10.3 10.1  HGB 10.8* 9.7*  HCT 32.7* 29.1*  PLT 306 217   BMET  Basename 04/17/12 0410 04/15/12 0423  NA 138 135  K 4.0 4.3  CL 103 103  CO2 28 26  GLUCOSE 98 141*  BUN 5* 8  CREATININE 0.72 0.72  CALCIUM 8.6 8.6   PT/INR No results found for this basename: LABPROT:2,INR:2 in the last 72 hours CMP     Component Value Date/Time   NA 138 04/17/2012 0410   K 4.0 04/17/2012 0410   CL 103 04/17/2012 0410   CO2 28 04/17/2012 0410   GLUCOSE 98 04/17/2012 0410   BUN 5* 04/17/2012 0410   CREATININE 0.72 04/17/2012 0410   CALCIUM 8.6 04/17/2012 0410   PROT 6.9 04/13/2012 2125   ALBUMIN 3.6  04/13/2012 2125   AST 19 04/13/2012 2125   ALT 28 04/13/2012 2125   ALKPHOS 90 04/13/2012 2125   BILITOT 1.5* 04/13/2012 2125   GFRNONAA >90 04/17/2012 0410   GFRAA >90 04/17/2012 0410   Lipase     Component Value Date/Time   LIPASE 124* 04/13/2012 2125       Studies/Results: No results found.  Anti-infectives: Anti-infectives     Start     Dose/Rate Route Frequency Ordered Stop   04/14/12 2130   fluconazole (DIFLUCAN) tablet 200 mg        200 mg Oral  Once 04/14/12 2037 04/14/12 2153   04/14/12 0800   cefOXitin (MEFOXIN) 1 g in dextrose 5 % 50 mL IVPB        1 g 100 mL/hr over 30 Minutes Intravenous Every 6 hours 04/14/12 0500             Assessment/Plan  1. S/p lap appy for perf appy 2. Post op ileus 3. Cellulitis of abdominal wall in LLQ  Plan: 1. Will D/W Dr/ Gerrit Friends.  We will likely change up her abx coverage since the patient has developed this large area of cellulitis since being on abx therapy. - will add vanc. 2. Continue on clear liquids,  do not feel her ileus has resolved enough to advance her yet. 3. Continue mobilization. 4. Suspect the pain on the right side may be gas related from left over CO2, but will also keep an eye on it as she is at risk for a post op abscess.   LOS: 4 days    Milton Sagona E 04/17/2012

## 2012-04-17 NOTE — Progress Notes (Signed)
ANTIBIOTIC CONSULT NOTE - Initial   Pharmacy Consult for Vancomycin  Indication: LLQ abdominal wall cellulitis   No Known Allergies  Patient Measurements: Height: 5\' 8"  (172.7 cm) Weight: 200 lb (90.719 kg) IBW/kg (Calculated) : 63.9    Vital Signs: Temp: 98.5 F (36.9 C) (08/21 0619) BP: 96/62 mmHg (08/21 0619) Pulse Rate: 84  (08/21 0619) Intake/Output from previous day: 08/20 0701 - 08/21 0700 In: 1665.3 [P.O.:540; I.V.:1075.3; IV Piggyback:50] Out: -  Intake/Output from this shift: Total I/O In: 100 [P.O.:60; I.V.:40] Out: -   Labs:  Basename 04/17/12 0410 04/15/12 0423  WBC 10.3 10.1  HGB 10.8* 9.7*  PLT 306 217  LABCREA -- --  CREATININE 0.72 0.72   Estimated Creatinine Clearance: 94.7 ml/min (by C-G formula based on Cr of 0.72). No results found for this basename: VANCOTROUGH:2,VANCOPEAK:2,VANCORANDOM:2,GENTTROUGH:2,GENTPEAK:2,GENTRANDOM:2,TOBRATROUGH:2,TOBRAPEAK:2,TOBRARND:2,AMIKACINPEAK:2,AMIKACINTROU:2,AMIKACIN:2, in the last 72 hours   Microbiology: No results found for this or any previous visit (from the past 720 hour(s)).  Medical History: History reviewed. No pertinent past medical history.  Medications:  Scheduled:    . cefOXitin  1 g Intravenous Q6H  . enoxaparin (LOVENOX) injection  40 mg Subcutaneous Q24H  . pantoprazole  40 mg Oral Q1200   Infusions:    . dextrose 5 % and 0.45 % NaCl with KCl 20 mEq/L 50 mL/hr at 04/17/12 0734   PRN: acetaminophen, bisacodyl, HYDROcodone-acetaminophen, LORazepam, morphine injection, ondansetron (ZOFRAN) IV, ondansetron, promethazine Assessment:  56 yo F POD#3 s/p lap appy for per appy, patient has developed large area of LLQ abdominal cellulitis to start vancomycin for MRSA coverage.    Wt 90 kg, Scr WNL with current CrCL 95 ml/min, Afebrile  No Micro Data  Goal of Therapy:  Vancomycin trough level 10-15 mcg/ml  Plan:  1.) Vancomycin 1250 mg IV q12h, first dose now 2.) Monitor renal  function 3.) Vancomycin trough levels as needed aiming for lower goal of 10-15 based on indication of cellulitis  Gurfateh Mcclain, Loma Messing PharmD 8:11 AM 04/17/2012

## 2012-04-17 NOTE — Progress Notes (Signed)
General Surgery [facility] Patient seen and examined.  Mild cellulitis left lower abdominal wall and flank.  No fluctuence.  No crepitance.  Will add Vancomycin for suspected staph or strep cellulitis. Velora Heckler, MD, [facility] Office: [phone]

## 2012-04-18 LAB — CBC
HCT: 31.5 % — ABNORMAL LOW (ref 36.0–46.0)
Hemoglobin: 10.7 g/dL — ABNORMAL LOW (ref 12.0–15.0)
MCH: 30.7 pg (ref 26.0–34.0)
MCHC: 34 g/dL (ref 30.0–36.0)

## 2012-04-18 LAB — BASIC METABOLIC PANEL
BUN: 4 mg/dL — ABNORMAL LOW (ref 6–23)
GFR calc non Af Amer: >90 mL/min (ref >90)
Glucose, Bld: 115 mg/dL — ABNORMAL HIGH (ref 70–99)
Potassium: 3.6 mEq/L (ref 3.5–5.1)

## 2012-04-18 MED ORDER — FLUCONAZOLE 100 MG PO TABS
100.0000 mg | ORAL_TABLET | Freq: Every day | ORAL | Status: DC
  Administered 2012-04-18 – 2012-04-22 (×5): 100 mg via ORAL
  Filled 2012-04-18 (×6): qty 1

## 2012-04-18 MED ORDER — PIPERACILLIN-TAZOBACTAM 3.375 G IVPB
3.3750 g | Freq: Three times a day (TID) | INTRAVENOUS | Status: DC
  Administered 2012-04-18 – 2012-04-20 (×6): 3.375 g via INTRAVENOUS
  Filled 2012-04-18 (×7): qty 50

## 2012-04-18 MED ORDER — METHOCARBAMOL 500 MG PO TABS
500.0000 mg | ORAL_TABLET | Freq: Four times a day (QID) | ORAL | Status: DC | PRN
  Administered 2012-04-18 – 2012-04-22 (×13): 500 mg via ORAL
  Filled 2012-04-18 (×14): qty 1

## 2012-04-18 NOTE — Progress Notes (Signed)
Patient ID: Tracy Bradley, female   DOB: [birth year], 56 y.o.   MRN: [id number] 4 Days Post-Op  Subjective: Pt feels a little better.  Passing more flatus.  Less nauseated.  Feels as though the pain in her side is a pulled muscle she has had since her surgery.  This makes it hard for her to take deep breaths.  Objective: Vital signs in last 24 hours: Temp:  [97.3 F (36.3 C)-98.3 F (36.8 C)] 98.3 F (36.8 C) (08/22 0550) Pulse Rate:  [80-88] 81  (08/22 0550) Resp:  [16] 16  (08/22 0550) BP: (98-110)/(61-68) 107/68 mmHg (08/22 0550) SpO2:  [91 %-96 %] 94 % (08/22 0550) Last BM Date: 04/16/12  Intake/Output from previous day: 08/21 0701 - 08/22 0700 In: 1700 [P.O.:460; I.V.:1240] Out: -  Intake/Output this shift:    PE: Abd: soft, +BS, incisions are c/d/i.  LLQ incision still with erythema.  Less towards the midline of her abdomen, but it has spread down to the top of her thigh and a little further towards her back.  The dermabond was removed from this incision.  A probe was used to spread the incision slightly to make sure there was no purulent fluid.  There was not.  This was covered back up.  Lab Results:   Basename 04/18/12 0340 04/17/12 0410  WBC 10.1 10.3  HGB 10.7* 10.8*  HCT 31.5* 32.7*  PLT 311 306   BMET  Basename 04/18/12 0340 04/17/12 0410  NA 138 138  K 3.6 4.0  CL 104 103  CO2 27 28  GLUCOSE 115* 98  BUN 4* 5*  CREATININE 0.65 0.72  CALCIUM 8.2* 8.6   PT/INR No results found for this basename: LABPROT:2,INR:2 in the last 72 hours CMP     Component Value Date/Time   NA 138 04/18/2012 0340   K 3.6 04/18/2012 0340   CL 104 04/18/2012 0340   CO2 27 04/18/2012 0340   GLUCOSE 115* 04/18/2012 0340   BUN 4* 04/18/2012 0340   CREATININE 0.65 04/18/2012 0340   CALCIUM 8.2* 04/18/2012 0340   PROT 6.9 04/13/2012 2125   ALBUMIN 3.6 04/13/2012 2125   AST 19 04/13/2012 2125   ALT 28 04/13/2012 2125   ALKPHOS 90 04/13/2012 2125   BILITOT 1.5* 04/13/2012 2125   GFRNONAA  >90 04/18/2012 0340   GFRAA >90 04/18/2012 0340   Lipase     Component Value Date/Time   LIPASE 124* 04/13/2012 2125       Studies/Results: No results found.  Anti-infectives: Anti-infectives     Start     Dose/Rate Route Frequency Ordered Stop   04/18/12 1000   fluconazole (DIFLUCAN) tablet 100 mg        100 mg Oral Daily 04/18/12 0758     04/17/12 0900   vancomycin (VANCOCIN) 1,250 mg in sodium chloride 0.9 % 250 mL IVPB        1,250 mg 166.7 mL/hr over 90 Minutes Intravenous Every 12 hours 04/17/12 0812     04/14/12 2130   fluconazole (DIFLUCAN) tablet 200 mg        200 mg Oral  Once 04/14/12 2037 04/14/12 2153   04/14/12 0800   cefOXitin (MEFOXIN) 1 g in dextrose 5 % 50 mL IVPB        1 g 100 mL/hr over 30 Minutes Intravenous Every 6 hours 04/14/12 0500             Assessment/Plan  1. S/p lap appy for perf appy 2.  Cellulitis of abdominal wall  Plan: 1. Pt requests a muscle relaxant for her side.  I told her we could try this, but we still have to be aware that this pain may be related to a post op abscess, but we would watch it.  Currently her WBC and temp are normal. 2. Bowels are starting to be more active, will advance to full liquids 3. Patient requests diflucan for yeast prophylaxis 4. Will follow abdominal wall cellulitis, the midline portion appears slightly improved, but worsening spread along the lateral inferior portion.  Question if we need to broaden coverage to vanc and zosyn, instead of vanc mefoxin?   LOS: 5 days    Julus Kelley E 04/18/2012

## 2012-04-18 NOTE — Progress Notes (Signed)
ANTIBIOTIC CONSULT NOTE - FOLLOW UP  Pharmacy Consult for:  Vancomycin Indication:   LLQ abdominal wall cellulitis  No Known Allergies  Patient Measurements: Height: 5\' 8"  (172.7 cm) Weight: 200 lb (90.719 kg) IBW/kg (Calculated) : 63.9   Vital Signs: Temp: 98.4 F (36.9 C) (08/22 1415) BP: 108/73 mmHg (08/22 1415) Pulse Rate: 80  (08/22 1415) Intake/Output from previous day: 08/21 0701 - 08/22 0700 In: 1800 [P.O.:460; I.V.:1240; IV Piggyback:100] Out: -    Labs:  Basename 04/18/12 0340 04/17/12 0410  WBC 10.1 10.3  HGB 10.7* 10.8*  PLT 311 306  LABCREA -- --  CREATININE 0.65 0.72   Estimated Creatinine Clearance: 94.7 ml/min (by C-G formula based on Cr of 0.65).  Basename 04/18/12 1955  VANCOTROUGH 11.9  VANCOPEAK --  Drue Dun --  GENTTROUGH --  GENTPEAK --  GENTRANDOM --  Adele Dan --  Nolen Mu --  TOBRARND --  AMIKACINPEAK --  AMIKACINTROU --  AMIKACIN --      Anti-infectives     Start     Dose/Rate Route Frequency Ordered Stop   04/18/12 1400  piperacillin-tazobactam (ZOSYN) IVPB 3.375 g       3.375 g 12.5 mL/hr over 240 Minutes Intravenous 3 times per day 04/18/12 0903     04/18/12 1000   fluconazole (DIFLUCAN) tablet 100 mg        100 mg Oral Daily 04/18/12 0758     04/17/12 0900   vancomycin (VANCOCIN) 1,250 mg in sodium chloride 0.9 % 250 mL IVPB        1,250 mg 166.7 mL/hr over 90 Minutes Intravenous Every 12 hours 04/17/12 0812     04/14/12 2130   fluconazole (DIFLUCAN) tablet 200 mg        200 mg Oral  Once 04/14/12 2037 04/14/12 2153   04/14/12 0800   cefOXitin (MEFOXIN) 1 g in dextrose 5 % 50 mL IVPB  Status:  Discontinued        1 g 100 mL/hr over 30 Minutes Intravenous Every 6 hours 04/14/12 0500 04/18/12 1610          Assessment:  Assisting with Vancomycin therapy for this 56 year-old female patient.  S/P laparoscopic appendectomy; developed mild cellulitis lower abdominal wall and flank.  Vancomycin trough level is  within the therapeutic range at 11.9 mcg/ml.  Goals of Therapy:   Vancomycin trough levels 10-15 mcg/ml  Eradication of infection  Plan:  Continue the current dose -- 1250 mg IV every 12 hours.  Polo Riley R.Ph. 04/18/2012 10:43 PM

## 2012-04-18 NOTE — Progress Notes (Signed)
General Surgery [facility] Patient seen and examined.  Cellulitis left flank improving slightly.  RLQ pain improved after Robaxin.  Tolerating liquids. Velora Heckler, MD, [facility] Office: [phone]

## 2012-04-19 DIAGNOSIS — I809 Phlebitis and thrombophlebitis of unspecified site: Secondary | ICD-10-CM | POA: Diagnosis not present

## 2012-04-19 DIAGNOSIS — T801XXA Vascular complications following infusion, transfusion and therapeutic injection, initial encounter: Secondary | ICD-10-CM | POA: Diagnosis not present

## 2012-04-19 LAB — CBC
MCHC: 33.4 g/dL (ref 30.0–36.0)
RBC: 3.4 MIL/uL — ABNORMAL LOW (ref 3.87–5.11)
WBC: 11 10*3/uL — ABNORMAL HIGH (ref 4.0–10.5)

## 2012-04-19 MED ORDER — ASPIRIN 81 MG PO CHEW
81.0000 mg | CHEWABLE_TABLET | Freq: Every day | ORAL | Status: DC
  Administered 2012-04-19 – 2012-04-22 (×4): 81 mg via ORAL
  Filled 2012-04-19 (×4): qty 1

## 2012-04-19 NOTE — Progress Notes (Signed)
Patient ID: Tracy Bradley, female   DOB: [birth year], 56 y.o.   MRN: [id number] 5 Days Post-Op  Subjective: Pt feels ok this morning.  Slight nausea last night, but improved.  Wants to try some regular food today so she can have toast and crackers.  Objective: Vital signs in last 24 hours: Temp:  [98.3 F (36.8 C)-99.3 F (37.4 C)] 98.5 F (36.9 C) (08/23 0629) Pulse Rate:  [73-80] 75  (08/23 0629) Resp:  [14-16] 14  (08/23 0629) BP: (108-115)/(73-77) 115/77 mmHg (08/23 0629) SpO2:  [93 %-97 %] 94 % (08/23 0629) Last BM Date: 04/16/12  Intake/Output from previous day: 08/22 0701 - 08/23 0700 In: 3251.7 [P.O.:480; I.V.:2371.7; IV Piggyback:400] Out: 8 [Urine:8] Intake/Output this shift:    PE: Abd: soft, cellulitis MUCH improved today.  Only a slight amount of erythema remains.  +BS, minimal tenderness  Lab Results:   Basename 04/19/12 0345 04/18/12 0340  WBC 11.0* 10.1  HGB 10.3* 10.7*  HCT 30.8* 31.5*  PLT 337 311   BMET  Basename 04/18/12 0340 04/17/12 0410  NA 138 138  K 3.6 4.0  CL 104 103  CO2 27 28  GLUCOSE 115* 98  BUN 4* 5*  CREATININE 0.65 0.72  CALCIUM 8.2* 8.6   PT/INR No results found for this basename: LABPROT:2,INR:2 in the last 72 hours CMP     Component Value Date/Time   NA 138 04/18/2012 0340   K 3.6 04/18/2012 0340   CL 104 04/18/2012 0340   CO2 27 04/18/2012 0340   GLUCOSE 115* 04/18/2012 0340   BUN 4* 04/18/2012 0340   CREATININE 0.65 04/18/2012 0340   CALCIUM 8.2* 04/18/2012 0340   PROT 6.9 04/13/2012 2125   ALBUMIN 3.6 04/13/2012 2125   AST 19 04/13/2012 2125   ALT 28 04/13/2012 2125   ALKPHOS 90 04/13/2012 2125   BILITOT 1.5* 04/13/2012 2125   GFRNONAA >90 04/18/2012 0340   GFRAA >90 04/18/2012 0340   Lipase     Component Value Date/Time   LIPASE 124* 04/13/2012 2125       Studies/Results: No results found.  Anti-infectives: Anti-infectives     Start     Dose/Rate Route Frequency Ordered Stop   04/18/12 1400   piperacillin-tazobactam (ZOSYN) IVPB 3.375 g       3.375 g 12.5 mL/hr over 240 Minutes Intravenous 3 times per day 04/18/12 0903     04/18/12 1000   fluconazole (DIFLUCAN) tablet 100 mg        100 mg Oral Daily 04/18/12 0758     04/17/12 0900   vancomycin (VANCOCIN) 1,250 mg in sodium chloride 0.9 % 250 mL IVPB        1,250 mg 166.7 mL/hr over 90 Minutes Intravenous Every 12 hours 04/17/12 0812     04/14/12 2130   fluconazole (DIFLUCAN) tablet 200 mg        200 mg Oral  Once 04/14/12 2037 04/14/12 2153   04/14/12 0800   cefOXitin (MEFOXIN) 1 g in dextrose 5 % 50 mL IVPB  Status:  Discontinued        1 g 100 mL/hr over 30 Minutes Intravenous Every 6 hours 04/14/12 0500 04/18/12 0903           Assessment/Plan  1. S/p lap appy for perf appy 2. Abdominal wall cellulitis, improving 3. Muscle strain  Plan: 1. Pt would like regular diet so she can eat toast and crackers.  I think this is ok. 2. Keep an eye on  her WBCs, barely trended up today, but want to make sure it doesn't keep trending up as she is at risk for a post op abscess. 3. Cont robaxin for muscle strain as this is helping. 4. Cont IV vanc and zosyn for abd wall cellulitis, but this is significantly improved today.   LOS: 6 days    Lovel Suazo E 04/19/2012

## 2012-04-19 NOTE — Progress Notes (Signed)
General Surgery [facility] Patient seen and examined.  Likely localized phlebitis in left forearm at old IV site.  Will start warm compresses and baby ASA.  Continue IV abx's through weekend.  Starting regular diet.  Follow WBC.  Possibly home 2-3 days. Velora Heckler, MD, [facility] Office: [phone]

## 2012-04-20 LAB — CBC
MCH: 30.2 pg (ref 26.0–34.0)
MCHC: 33.4 g/dL (ref 30.0–36.0)
MCV: 90.3 fL (ref 78.0–100.0)
Platelets: 406 10*3/uL — ABNORMAL HIGH (ref 150–400)
RDW: 13.4 % (ref 11.5–15.5)

## 2012-04-20 MED ORDER — IBUPROFEN 600 MG PO TABS
600.0000 mg | ORAL_TABLET | Freq: Three times a day (TID) | ORAL | Status: DC
  Administered 2012-04-20 – 2012-04-22 (×6): 600 mg via ORAL
  Filled 2012-04-20 (×9): qty 1

## 2012-04-20 MED ORDER — AMOXICILLIN-POT CLAVULANATE 875-125 MG PO TABS
1.0000 | ORAL_TABLET | Freq: Two times a day (BID) | ORAL | Status: DC
  Administered 2012-04-20 – 2012-04-22 (×5): 1 via ORAL
  Filled 2012-04-20 (×6): qty 1

## 2012-04-20 NOTE — Progress Notes (Signed)
6 Days Post-Op  Subjective: Tolerating diet.  Her only complaint is right flank and back pain. +BM  Objective: Vital signs in last 24 hours: Temp:  [98.3 F (36.8 C)-100 F (37.8 C)] 98.3 F (36.8 C) (08/24 0643) Pulse Rate:  [67-82] 67  (08/24 0643) Resp:  [16] 16  (08/24 0643) BP: (108-143)/(65-79) 143/77 mmHg (08/24 0643) SpO2:  [93 %-94 %] 93 % (08/24 0643) Last BM Date: 04/16/12  Intake/Output from previous day: 08/23 0701 - 08/24 0700 In: 3165 [P.O.:1260; I.V.:1905] Out: 5 [Urine:4; Stool:1] Intake/Output this shift:    General appearance: alert, cooperative and no distress Back: no palpable tenderness, no infection, no evidence of problem Resp: clear to auscultation bilaterally and nonlabored, sats normal Cardio: normal rate, regular GI: soft, nt, ND, wounds without infection, LLQ cellulitis nearly resolved, no crepitus, no peritoneal signs Extremities: Lt extremity with improved tenderness and no erythema today. no purulence from IV site.  Lab Results:   Basename 04/20/12 0458 04/19/12 0345  WBC 10.2 11.0*  HGB 10.6* 10.3*  HCT 31.7* 30.8*  PLT 406* 337   BMET  Basename 04/18/12 0340  NA 138  K 3.6  CL 104  CO2 27  GLUCOSE 115*  BUN 4*  CREATININE 0.65  CALCIUM 8.2*   PT/INR No results found for this basename: LABPROT:2,INR:2 in the last 72 hours ABG No results found for this basename: PHART:2,PCO2:2,PO2:2,HCO3:2 in the last 72 hours  Studies/Results: No results found.  Anti-infectives: Anti-infectives     Start     Dose/Rate Route Frequency Ordered Stop   04/20/12 1000   amoxicillin-clavulanate (AUGMENTIN) 875-125 MG per tablet 1 tablet        1 tablet Oral Every 12 hours 04/20/12 0806     04/18/12 1400   piperacillin-tazobactam (ZOSYN) IVPB 3.375 g  Status:  Discontinued        3.375 g 12.5 mL/hr over 240 Minutes Intravenous 3 times per day 04/18/12 0903 04/20/12 0806   04/18/12 1000   fluconazole (DIFLUCAN) tablet 100 mg        100 mg  Oral Daily 04/18/12 0758     04/17/12 0900   vancomycin (VANCOCIN) 1,250 mg in sodium chloride 0.9 % 250 mL IVPB  Status:  Discontinued        1,250 mg 166.7 mL/hr over 90 Minutes Intravenous Every 12 hours 04/17/12 0812 04/20/12 0806   04/14/12 2130   fluconazole (DIFLUCAN) tablet 200 mg        200 mg Oral  Once 04/14/12 2037 04/14/12 2153   04/14/12 0800   cefOXitin (MEFOXIN) 1 g in dextrose 5 % 50 mL IVPB  Status:  Discontinued        1 g 100 mL/hr over 30 Minutes Intravenous Every 6 hours 04/14/12 0500 04/18/12 4098          Assessment/Plan: s/p Procedure(s) (LRB): APPENDECTOMY LAPAROSCOPIC (N/A) will try oral abx.  cellulitis and phlebitis nearly resolved. unsure of cause of back pain. no obvious source.  Doubt intraabdominal abscess, since she has no abdominal pain and tolerating diet without nausea.  no evidence of PE.  Sats normal and breathing comfortably on RA and on Lovenox since her procedure.  She says that this has been present since before her procedure and may be due to lymphadenopathy seen on CT or soft tissue strain.  She says that muscle relaxant has helped.  Will add antiinflammatory as well.  LOS: 7 days    Lodema Pilot DAVID 04/20/2012

## 2012-04-21 LAB — CBC WITH DIFFERENTIAL/PLATELET
Basophils Absolute: 0.1 10*3/uL (ref 0.0–0.1)
Eosinophils Absolute: 0.7 10*3/uL (ref 0.0–0.7)
HCT: 30.3 % — ABNORMAL LOW (ref 36.0–46.0)
Lymphocytes Relative: 23 % (ref 12–46)
MCHC: 33.7 g/dL (ref 30.0–36.0)
Neutro Abs: 5.5 10*3/uL (ref 1.7–7.7)
Neutrophils Relative %: 59 % (ref 43–77)
RDW: 13.7 % (ref 11.5–15.5)

## 2012-04-21 NOTE — Progress Notes (Signed)
7 Days Post-Op  Subjective: Feels much better.  Back pain greatly improved.  No breathing problems.  Objective: Vital signs in last 24 hours: Temp:  [97.2 F (36.2 C)-99 F (37.2 C)] 97.2 F (36.2 C) (08/25 0503) Pulse Rate:  [64-78] 78  (08/25 0503) Resp:  [14-16] 16  (08/25 0503) BP: (100-126)/(65-75) 100/65 mmHg (08/25 0503) SpO2:  [93 %-99 %] 99 % (08/25 0503) Last BM Date: 04/19/12  Intake/Output from previous day: 08/24 0701 - 08/25 0700 In: 1660 [P.O.:60; I.V.:1600] Out: 2 [Urine:2] Intake/Output this shift:    General appearance: alert, cooperative and no distress Resp: nonlabored Cardio: normal rate, regular GI: soft, NT, ND, wounds without infection, abd. wall cellulitis improved Skin: left arm phlebitis improved.  Lab Results:   Basename 04/21/12 0419 04/20/12 0458  WBC 9.4 10.2  HGB 10.2* 10.6*  HCT 30.3* 31.7*  PLT 420* 406*   BMET No results found for this basename: NA:2,K:2,CL:2,CO2:2,GLUCOSE:2,BUN:2,CREATININE:2,CALCIUM:2 in the last 72 hours PT/INR No results found for this basename: LABPROT:2,INR:2 in the last 72 hours ABG No results found for this basename: PHART:2,PCO2:2,PO2:2,HCO3:2 in the last 72 hours  Studies/Results: No results found.  Anti-infectives: Anti-infectives     Start     Dose/Rate Route Frequency Ordered Stop   04/20/12 1200   amoxicillin-clavulanate (AUGMENTIN) 875-125 MG per tablet 1 tablet        1 tablet Oral Every 12 hours 04/20/12 0806     04/18/12 1400   piperacillin-tazobactam (ZOSYN) IVPB 3.375 g  Status:  Discontinued        3.375 g 12.5 mL/hr over 240 Minutes Intravenous 3 times per day 04/18/12 0903 04/20/12 0806   04/18/12 1000   fluconazole (DIFLUCAN) tablet 100 mg        100 mg Oral Daily 04/18/12 0758     04/17/12 0900   vancomycin (VANCOCIN) 1,250 mg in sodium chloride 0.9 % 250 mL IVPB  Status:  Discontinued        1,250 mg 166.7 mL/hr over 90 Minutes Intravenous Every 12 hours 04/17/12 0812 04/20/12  0806   04/14/12 2130   fluconazole (DIFLUCAN) tablet 200 mg        200 mg Oral  Once 04/14/12 2037 04/14/12 2153   04/14/12 0800   cefOXitin (MEFOXIN) 1 g in dextrose 5 % 50 mL IVPB  Status:  Discontinued        1 g 100 mL/hr over 30 Minutes Intravenous Every 6 hours 04/14/12 0500 04/18/12 6045          Assessment/Plan: s/p Procedure(s) (LRB): APPENDECTOMY LAPAROSCOPIC (N/A) Doing much better.  Back pain and SOB resolved.  Cellulitis continues to improve. Wbc down and remains AF. Now on oral abx. Heplock and should be okay for discharge tomorrow.  LOS: 8 days    Lodema Pilot DAVID 04/21/2012

## 2012-04-22 ENCOUNTER — Telehealth (INDEPENDENT_AMBULATORY_CARE_PROVIDER_SITE_OTHER): Payer: Self-pay | Admitting: General Surgery

## 2012-04-22 ENCOUNTER — Other Ambulatory Visit (INDEPENDENT_AMBULATORY_CARE_PROVIDER_SITE_OTHER): Payer: Self-pay | Admitting: General Surgery

## 2012-04-22 ENCOUNTER — Encounter (HOSPITAL_COMMUNITY): Payer: Self-pay | Admitting: General Surgery

## 2012-04-22 ENCOUNTER — Encounter (INDEPENDENT_AMBULATORY_CARE_PROVIDER_SITE_OTHER): Payer: Self-pay | Admitting: General Surgery

## 2012-04-22 DIAGNOSIS — K567 Ileus, unspecified: Secondary | ICD-10-CM | POA: Diagnosis not present

## 2012-04-22 DIAGNOSIS — T148XXA Other injury of unspecified body region, initial encounter: Secondary | ICD-10-CM

## 2012-04-22 DIAGNOSIS — K9189 Other postprocedural complications and disorders of digestive system: Secondary | ICD-10-CM | POA: Diagnosis not present

## 2012-04-22 DIAGNOSIS — R110 Nausea: Secondary | ICD-10-CM

## 2012-04-22 HISTORY — DX: Ileus, unspecified: K56.7

## 2012-04-22 HISTORY — DX: Other postprocedural complications and disorders of digestive system: K91.89

## 2012-04-22 HISTORY — DX: Other injury of unspecified body region, initial encounter: T14.8XXA

## 2012-04-22 MED ORDER — AMOXICILLIN-POT CLAVULANATE 875-125 MG PO TABS: 1.0000 | ORAL_TABLET | Freq: Two times a day (BID) | ORAL | Status: AC

## 2012-04-22 MED ORDER — HYDROCODONE-ACETAMINOPHEN 5-325 MG PO TABS: 1.0000 | ORAL_TABLET | ORAL | Status: AC | PRN

## 2012-04-22 MED ORDER — ONDANSETRON HCL 4 MG PO TABS: 4.0000 mg | ORAL_TABLET | Freq: Three times a day (TID) | ORAL | Status: AC | PRN

## 2012-04-22 MED ORDER — LORAZEPAM 1 MG PO TABS: 1.0000 mg | ORAL_TABLET | Freq: Every day | ORAL | Status: DC

## 2012-04-22 MED ORDER — IBUPROFEN 200 MG PO TABS: ORAL_TABLET | ORAL | Status: DC

## 2012-04-22 MED ORDER — ACETAMINOPHEN 325 MG PO TABS: 650.0000 mg | ORAL_TABLET | ORAL | Status: AC | PRN

## 2012-04-22 MED ORDER — ASPIRIN 81 MG PO CHEW: 81.0000 mg | CHEWABLE_TABLET | Freq: Every day | ORAL | Status: AC

## 2012-04-22 MED ORDER — METHOCARBAMOL 500 MG PO TABS: 500.0000 mg | ORAL_TABLET | Freq: Four times a day (QID) | ORAL | Status: AC | PRN

## 2012-04-22 NOTE — Discharge Summary (Signed)
Physician Discharge Summary  Patient ID: Tracy Bradley MRN: [id number] DOB/AGE: 1956/01/21 56 y.o.  Admit date: 04/13/2012 Discharge date: 04/22/2012  Admission Diagnoses:  Acute suppurative appendicitis, ruptured. S/p Laparoscopic appendectomy. 04/14/2012 Dr. Ovidio Kin POD#8  4 days of diflucan  Post op ileus  Muscle strain on Robaxin starting 5th day.  Cellulitis of Abdominal wall LLQ started on Augmentin 04/19/12 BMI 30.5   Discharge Diagnoses: same Active Problems:  Phlebitis following infusion, left forearm  Ileus following gastrointestinal surgery  Muscle strain   PROCEDURES: S/p Laparoscopic appendectomy. 04/14/2012 Dr. Alfredia Ferguson Course: Tracy Bradley is a 56 y.o. (DOB: Sep 01, 1955) white female whose primary care physician is TODD,JEFFREY ALLEN, MD and comes to the [facility] ER with 2 days of worsening abdominal pain.  She developed nausea and dry heaves on 04/11/2012. She had a fever on Friday, 8/16, and then by Sat., 8/17 had worsening abdominal pain. She came on to the [facility] ER.  No history of stomach disease. No history of liver disease. No history of gall bladder disease. No history of pancreas disease. No history of colon disease or colonoscopy.  She had a TL in 1998. Her mother had appendicitis a couple of years ago.  She was evaluated about 2 years ago for abdominal adenopathy (this was discovered when she had a CT scan for her kidney stone) by Dr. Leonard Schwartz. [place], but nothing came of this.  CT scan - 04/13/2012 - acute appendicitis, mesenteric lymphadenopathy concerning for lymphoma  She was seen in the ER and taken to the OR, where she had above procedure.  She had a post op ileus, some pain RUQ attributed to muscle strain, and some LLQ cellulitis.  She was maintained on Antibiotics, he diet was advanced.  She was on Diflucan for 4 days due to hx of vaginitis with antibiotics. By the AM of 04/22/12 she was doing well and ready for discharge. Follow up with Dr. Ezzard Standing in 2-3  weeks; DR. Todd as needed.  Condition on D/C:  Improved.    Disposition: Final discharge disposition not confirmed   Medication List  As of 04/22/2012  7:48 AM   TAKE these medications         acetaminophen 325 MG tablet   Commonly known as: TYLENOL   Take 2 tablets (650 mg total) by mouth every 4 (four) hours as needed.      amoxicillin-clavulanate 875-125 MG per tablet   Commonly known as: AUGMENTIN   Take 1 tablet by mouth every 12 (twelve) hours.      aspirin 81 MG chewable tablet   Chew 1 tablet (81 mg total) by mouth daily.      HYDROcodone-acetaminophen 5-325 MG per tablet   Commonly known as: NORCO/VICODIN   Take 1-2 tablets by mouth every 4 (four) hours as needed.      ibuprofen 200 MG tablet   Commonly known as: ADVIL,MOTRIN   2-3 tablets every 6 hours as needed for pain.      LORazepam 1 MG tablet   Commonly known as: ATIVAN   Take 1 mg by mouth at bedtime.      methocarbamol 500 MG tablet   Commonly known as: ROBAXIN   Take 1 tablet (500 mg total) by mouth every 6 (six) hours as needed.           Follow-up Information    Follow up with NEWMAN,DAVID H, MD. Schedule an appointment as soon as possible for a visit in 3 weeks.  Contact information:   [address] [place] [address] [phone]       Follow up with TODD,JEFFREY Freida Busman, MD. (Call for follow up and any medical issues as needed)    Contact information:   [address] [place] [address] [phone]          Signed: Sherrie George 04/22/2012, 7:48 AM

## 2012-04-22 NOTE — Telephone Encounter (Signed)
Called patient and left message to advised prescription for nausea (Zofran) was issued and the pharmacy confirmed they received the electronic request. Advised patient the script is ready for pick up.

## 2012-04-22 NOTE — Progress Notes (Signed)
8 Days Post-Op  Subjective: Feeling much better.  Still complaining of muscle strain. Over all doing well and wants to go home  Objective: Vital signs in last 24 hours: Temp:  [98.3 F (36.8 C)-98.7 F (37.1 C)] 98.3 F (36.8 C) (08/26 0500) Pulse Rate:  [65-73] 73  (08/26 0500) Resp:  [16-20] 20  (08/26 0500) BP: (101-124)/(67-68) 103/67 mmHg (08/26 0500) SpO2:  [94 %-99 %] 94 % (08/26 0500) Last BM Date: 04/20/12  120 PO recorded, +BM, Diet; regular, afebrile, VSS, labs OK yesterday.  Intake/Output from previous day: 08/25 0701 - 08/26 0700 In: 340.8 [P.O.:120; I.V.:220.8] Out: 2 [Urine:2] Intake/Output this shift:    General appearance: alert, cooperative and no distress Resp: clear to auscultation bilaterally GI: soft, non-tender; bowel sounds normal; no masses,  no organomegaly and incisions look good.  Cellulits is essentially resolved.  Lab Results:   Basename 04/21/12 0419 04/20/12 0458  WBC 9.4 10.2  HGB 10.2* 10.6*  HCT 30.3* 31.7*  PLT 420* 406*    BMET No results found for this basename: NA:2,K:2,CL:2,CO2:2,GLUCOSE:2,BUN:2,CREATININE:2,CALCIUM:2 in the last 72 hours PT/INR No results found for this basename: LABPROT:2,INR:2 in the last 72 hours  No results found for this basename: AST:5,ALT:5,ALKPHOS:5,BILITOT:5,PROT:5,ALBUMIN:5 in the last 168 hours   Lipase     Component Value Date/Time   LIPASE 124* 04/13/2012 2125     Studies/Results: No results found.  Medications:    . amoxicillin-clavulanate  1 tablet Oral Q12H  . aspirin  81 mg Oral Daily  . enoxaparin (LOVENOX) injection  40 mg Subcutaneous Q24H  . fluconazole  100 mg Oral Daily  . ibuprofen  600 mg Oral TID WC  . pantoprazole  40 mg Oral Q1200    Assessment/Plan Acute suppurative appendicitis, ruptured. S/p Laparoscopic appendectomy. 04/14/2012 Dr. Ovidio Kin POD#8 4 days of diflucan Post op ileus Muscle strain on Robaxin starting 5th day. Cellulitis of Abdominal wall LLQ  started on Augmentin 04/19/12 BMI 30.5    Plan:  Home on 5 more days of Augmentin..  Follow up Dr. Ezzard Standing and PCP.     LOS: 9 days    Tracy Bradley 04/22/2012

## 2012-04-22 NOTE — Telephone Encounter (Signed)
Patient called in stating she was just released from the hospital today. She wanted to know is something can be issued for nausea due to the antibiotics. She said she has been taking them with food, but the nausea has been too much. Advised I would page Dr. Ezzard Standing to see if anything can be issued for her to help. Patient agreed.

## 2012-04-22 NOTE — Progress Notes (Signed)
Improving  Cont ABx

## 2012-04-22 NOTE — Discharge Summary (Signed)
Meets d/c criteria D/C on Augmentin to min risk of abscess

## 2012-04-26 ENCOUNTER — Ambulatory Visit (INDEPENDENT_AMBULATORY_CARE_PROVIDER_SITE_OTHER): Payer: BC Managed Care – PPO | Admitting: Surgery

## 2012-04-26 ENCOUNTER — Encounter (INDEPENDENT_AMBULATORY_CARE_PROVIDER_SITE_OTHER): Payer: Self-pay | Admitting: Surgery

## 2012-04-26 VITALS — BP 138/82 | HR 72 | Temp 98.1°F | Resp 18 | Ht 68.5 in | Wt 209.0 lb

## 2012-04-26 DIAGNOSIS — Z9049 Acquired absence of other specified parts of digestive tract: Secondary | ICD-10-CM

## 2012-04-26 DIAGNOSIS — Z9889 Other specified postprocedural states: Secondary | ICD-10-CM

## 2012-04-26 NOTE — Progress Notes (Signed)
[facility]  Ovidio Kin, MD,  FACS [address] [place]  [address] [place], [place]    [address] Phone:  [phone] FAX:  [phone]   Re:   Tracy Bradley DOB:   [birth year] MRN:   [id number]  ASSESSMENT AND PLAN: 1.  Lap appendectomy - 04/14/2012  (D/Ced 04/22/2012 from [facility])  On Augmentin 875 mg  - but last dose today.  I gave her 5 more days of Augmentin 875 (total 16 days of antibiotics)  She has no physical evidence of an intra-abdominal infection  Since there is still a question of how she is doing,  I will see her back in 3 weeks.   2. Obese  3. Mesenteric adenopathy on CT scan,   History of adenopathy worked up by Dr. Leonard Schwartz. [place] approx 2011 - negative.  Since the CT scan suggested "slight progressive lymphadenopathy" she is going to follow up with Dr. Tawanna Cooler. 4. History of kidney stones.  HISTORY OF PRESENT ILLNESS: Chief Complaint  Patient presents with  . Routine Post Op    Tracy Bradley is a 56 y.o. (DOB: 07-08-1956)  white female who is a patient of TODD,JEFFREY ALLEN, MD and comes to me today for follow up of a lap appendectomy.  She is accompanied by her husband.  She is still having "fevers" at night - but when she hits 100, she takes Ibuprofen - so I am not sure what to make of this.  I am going to give her some more antibiotics.  But I told her that if these symptoms continue beyond the antibiotics, she may need a repeat CT scan.  She did not have the night fevers before the appendicitis.  PHYSICAL EXAM: BP 138/82  Pulse 72  Temp 98.1 F (36.7 C) (Oral)  Resp 18  Ht 5' 8.5" (1.74 m)  Wt 209 lb (94.802 kg)  BMI 31.32 kg/m2  Abdomen:  Incisions looks good.  According to [facility], she got some significant cellulitis around her LLQ incision.  This has resolved.  Her abdomen is soft.  BS present.  DATA REVIEWED: Path report to patient.  Ovidio Kin, MD, FACS Office:  [phone]

## 2012-05-04 ENCOUNTER — Other Ambulatory Visit: Payer: Self-pay | Admitting: Family Medicine

## 2012-05-17 ENCOUNTER — Ambulatory Visit (INDEPENDENT_AMBULATORY_CARE_PROVIDER_SITE_OTHER): Payer: BC Managed Care – PPO | Admitting: Surgery

## 2012-05-17 ENCOUNTER — Encounter (INDEPENDENT_AMBULATORY_CARE_PROVIDER_SITE_OTHER): Payer: BC Managed Care – PPO | Admitting: Surgery

## 2012-05-17 ENCOUNTER — Encounter (INDEPENDENT_AMBULATORY_CARE_PROVIDER_SITE_OTHER): Payer: Self-pay | Admitting: Surgery

## 2012-05-17 VITALS — BP 112/64 | HR 92 | Temp 97.9°F | Resp 16 | Ht 68.5 in | Wt 199.6 lb

## 2012-05-17 DIAGNOSIS — Z9049 Acquired absence of other specified parts of digestive tract: Secondary | ICD-10-CM

## 2012-05-17 DIAGNOSIS — Z9889 Other specified postprocedural states: Secondary | ICD-10-CM

## 2012-05-17 NOTE — Progress Notes (Signed)
[facility]  Ovidio Kin, MD,  FACS [address] [place]  [address] [place], [place]    [address] Phone:  [phone] FAX:  [phone]   Re:   Tracy Bradley DOB:   [birth year] MRN:   [id number]  ASSESSMENT AND PLAN: 1.  Lap appendectomy - 04/14/2012  (D/Ced 04/22/2012 from [facility])  Doing better than last visit.  I made this her last visit with me.  2. Obese  3. Mesenteric adenopathy on CT scan,   History of adenopathy worked up by Dr. Leonard Schwartz. [place] approx 2011 - negative.  Since the CT scan suggested "slight progressive lymphadenopathy" she is going to follow up with Dr. Tawanna Cooler.  She was waiting until she saw me today before following up with Dr. Tawanna Cooler. 4. History of kidney stones.  HISTORY OF PRESENT ILLNESS: Chief Complaint  Patient presents with  . Routine Post Op    1st PO appy   Tracy Bradley is a 56 y.o. (DOB: Apr 29, 1956)  white female who is a patient of TODD,JEFFREY ALLEN, MD and comes to me today for follow up of a lap appendectomy.   She is doing much better than last time.  No abdominal pain or complaint.  No fever.  PHYSICAL EXAM: BP 112/64  Pulse 92  Temp 97.9 F (36.6 C) (Temporal)  Resp 16  Ht 5' 8.5" (1.74 m)  Wt 199 lb 9.6 oz (90.538 kg)  BMI 29.91 kg/m2  General:  Doing better than last time. Abdomen:  Incisions looks good.   DATA REVIEWED: None new.  Ovidio Kin, MD, FACS Office:  [phone]

## 2012-05-22 ENCOUNTER — Other Ambulatory Visit: Payer: Self-pay | Admitting: Family Medicine

## 2012-08-11 ENCOUNTER — Other Ambulatory Visit: Payer: Self-pay | Admitting: Family Medicine

## 2012-10-23 ENCOUNTER — Telehealth: Payer: Self-pay | Admitting: Family Medicine

## 2012-10-23 NOTE — Telephone Encounter (Signed)
Pt has head cold, chest congestion, sore throat. Pt's mother is in the hospital (stroke) Pt found mom today. Since pt has the head cold, they do not want her around mom. Would like to know if you could call something in. Pharn: [facility] [place], [place] [facility] (mom is a pt of Dr Tawanna Cooler also)

## 2012-10-24 MED ORDER — HYDROCODONE-HOMATROPINE 5-1.5 MG/5ML PO SYRP: 5.0000 mL | ORAL_SOLUTION | Freq: Three times a day (TID) | ORAL | Status: DC | PRN

## 2012-10-24 NOTE — Telephone Encounter (Signed)
Hydromet per Dr Tawanna Cooler - Left message on machine for patient

## 2012-10-25 ENCOUNTER — Ambulatory Visit (INDEPENDENT_AMBULATORY_CARE_PROVIDER_SITE_OTHER): Payer: BC Managed Care – PPO | Admitting: Family Medicine

## 2012-10-25 VITALS — BP 124/80 | HR 92 | Temp 98.0°F | Wt 222.0 lb

## 2012-10-25 MED ORDER — AZITHROMYCIN 250 MG PO TABS: ORAL_TABLET | ORAL | Status: DC

## 2012-10-25 NOTE — Progress Notes (Signed)
Chief Complaint  Patient presents with  . Bronchitis    cough, laryngitis, fever about 1 week ago, mucus green, headache     HPI:  Acute visit for ? Pneumonia: -cold symptoms initially started about a week ago - but seems to be getting worse -called in and got hycodan cough medication -cough has persisted and wondered if has pneumonia - coughing up green mucus, wheezing, had a fever initially, chills, felt like had nausea last night -mom dying and under a lot of stress -no hx of lung disease -denies: NVD, SOB -husband is sick but he is getting better  ROS: See pertinent positives and negatives per HPI.  Past Medical History  Diagnosis Date  . Ileus following gastrointestinal surgery 04/22/2012  . Muscle strain 04/22/2012    Family History  Problem Relation Age of Onset  . Lymphoma Father     History   Social History  . Marital Status: Married    Spouse Name: N/A    Number of Children: N/A  . Years of Education: N/A   Social History Main Topics  . Smoking status: Never Smoker   . Smokeless tobacco: Not on file  . Alcohol Use: No  . Drug Use: No  . Sexually Active: Yes    Birth Control/ Protection: None   Other Topics Concern  . Not on file   Social History Narrative  . No narrative on file    Current outpatient prescriptions:acetaminophen (TYLENOL) 325 MG tablet, Take 2 tablets (650 mg total) by mouth every 4 (four) hours as needed., Disp: , Rfl: ;  aspirin 81 MG chewable tablet, Chew 1 tablet (81 mg total) by mouth daily., Disp: , Rfl: ;  fluconazole (DIFLUCAN) 100 MG tablet, TAKE 1 TABLET BY MOUTH NOW AND 1 TAB IN 5 DAYS, Disp: 2 tablet, Rfl: 1 HYDROcodone-homatropine (HYCODAN) 5-1.5 MG/5ML syrup, Take 5 mLs by mouth every 8 (eight) hours as needed for cough., Disp: 120 mL, Rfl: 1;  ibuprofen (ADVIL) 200 MG tablet, 2-3 tablets every 6 hours as needed for pain., Disp: 30 tablet, Rfl: 0;  LORazepam (ATIVAN) 1 MG tablet, TAKE 1 TABLET BY MOUTH AT BEDTIME, Disp: 90  tablet, Rfl: 0;  PREMARIN vaginal cream, USE AS DIRECTED, Disp: 30 g, Rfl: 0 azithromycin (ZITHROMAX) 250 MG tablet, 2 tabs on the first day and then 1 tab daily for the next 4 days, Disp: 6 tablet, Rfl: 0  EXAM:  Filed Vitals:   10/25/12 1414  BP: 124/80  Pulse: 92  Temp: 98 F (36.7 C)    Body mass index is 33.26 kg/(m^2).  GENERAL: vitals reviewed and listed above, alert, oriented, appears well hydrated and in no acute distress  HEENT: atraumatic, conjunttiva clear, no obvious abnormalities on inspection of external nose and ears, normal appearance of ear canals and TMs, clear nasal congestion, mild post oropharyngeal erythema with PND, no tonsillar edema or exudate, no sinus TTP  NECK: no obvious masses on inspection  LUNGS: clear to auscultation bilaterally, no wheezes, rales or rhonchi, good air movement  CV: HRRR, no peripheral edema  MS: moves all extremities without noticeable abnormality  PSYCH: pleasant and cooperative, no obvious depression or anxiety  ASSESSMENT AND PLAN:  Discussed the following assessment and plan:  Upper respiratory infection - Plan: azithromycin (ZITHROMAX) 250 MG tablet  -discussed this is likely viral, however given chills, worsening and going out of town abx provided to start if worsening or not improving, discussed risks -she has cough medication -follow up prn -supported  at length is recent dificcult times -Patient advised to return or notify a doctor immediately if symptoms worsen or persist or new concerns arise.  There are no Patient Instructions on file for this visit.   Kriste Basque R.

## 2012-11-05 ENCOUNTER — Other Ambulatory Visit: Payer: Self-pay | Admitting: Family Medicine

## 2012-11-06 ENCOUNTER — Telehealth: Payer: Self-pay | Admitting: *Deleted

## 2012-11-06 NOTE — Telephone Encounter (Signed)
Ok # 30 trefill x 2

## 2012-11-06 NOTE — Telephone Encounter (Signed)
Patient requesting a refill of Ativan.  Last office visit was 07/2011.  Okay to fill?

## 2012-11-06 NOTE — Telephone Encounter (Signed)
Rx called to pharmacy

## 2013-02-02 ENCOUNTER — Other Ambulatory Visit: Payer: Self-pay | Admitting: Family Medicine

## 2013-02-26 ENCOUNTER — Other Ambulatory Visit: Payer: Self-pay | Admitting: Family Medicine

## 2013-02-26 NOTE — Telephone Encounter (Signed)
Refill #15 only.  Pt needs office follow up with her primary.

## 2013-02-26 NOTE — Telephone Encounter (Signed)
Can you approve this since Dr Tawanna Cooler is not here?

## 2013-03-01 ENCOUNTER — Other Ambulatory Visit: Payer: Self-pay | Admitting: Family Medicine

## 2013-03-12 ENCOUNTER — Other Ambulatory Visit: Payer: Self-pay | Admitting: Family Medicine

## 2013-03-29 ENCOUNTER — Other Ambulatory Visit: Payer: Self-pay | Admitting: Family Medicine

## 2013-04-04 ENCOUNTER — Other Ambulatory Visit: Payer: Self-pay | Admitting: Family Medicine

## 2013-04-18 ENCOUNTER — Other Ambulatory Visit: Payer: Self-pay | Admitting: Family Medicine

## 2013-05-21 ENCOUNTER — Other Ambulatory Visit: Payer: Self-pay | Admitting: Family Medicine

## 2013-08-01 ENCOUNTER — Other Ambulatory Visit: Payer: Self-pay | Admitting: Family Medicine

## 2013-09-08 ENCOUNTER — Other Ambulatory Visit: Payer: Self-pay | Admitting: Family Medicine

## 2013-09-12 ENCOUNTER — Other Ambulatory Visit: Payer: Self-pay | Admitting: Family Medicine

## 2013-10-08 ENCOUNTER — Other Ambulatory Visit: Payer: Self-pay | Admitting: Family Medicine

## 2013-10-25 ENCOUNTER — Other Ambulatory Visit: Payer: Self-pay | Admitting: Family Medicine

## 2013-11-05 ENCOUNTER — Other Ambulatory Visit: Payer: Self-pay | Admitting: Family Medicine

## 2015-05-04 ENCOUNTER — Ambulatory Visit (INDEPENDENT_AMBULATORY_CARE_PROVIDER_SITE_OTHER): Payer: Self-pay | Admitting: *Deleted

## 2015-05-04 DIAGNOSIS — Z23 Encounter for immunization: Secondary | ICD-10-CM

## 2016-02-24 ENCOUNTER — Ambulatory Visit: Payer: Self-pay | Admitting: Family Medicine

## 2017-03-12 ENCOUNTER — Ambulatory Visit (INDEPENDENT_AMBULATORY_CARE_PROVIDER_SITE_OTHER): Payer: PRIVATE HEALTH INSURANCE | Admitting: *Deleted

## 2017-03-12 DIAGNOSIS — Z23 Encounter for immunization: Secondary | ICD-10-CM

## 2017-03-12 NOTE — Progress Notes (Signed)
Patient here for shingrix vaccine; administered vaccine IM to left deltoid; patient tolerated well; reviewed s/s of potential reactions; patient voiced understanding. Patient to return for 2nd shingrix vaccine in 2-506mths, appt scheduled.

## 2017-05-14 ENCOUNTER — Ambulatory Visit (INDEPENDENT_AMBULATORY_CARE_PROVIDER_SITE_OTHER): Payer: PRIVATE HEALTH INSURANCE | Admitting: *Deleted

## 2017-05-14 DIAGNOSIS — Z23 Encounter for immunization: Secondary | ICD-10-CM | POA: Diagnosis not present

## 2017-06-08 ENCOUNTER — Ambulatory Visit (INDEPENDENT_AMBULATORY_CARE_PROVIDER_SITE_OTHER): Payer: PRIVATE HEALTH INSURANCE | Admitting: *Deleted

## 2017-06-08 DIAGNOSIS — Z23 Encounter for immunization: Secondary | ICD-10-CM

## 2018-05-31 ENCOUNTER — Ambulatory Visit (INDEPENDENT_AMBULATORY_CARE_PROVIDER_SITE_OTHER): Payer: PRIVATE HEALTH INSURANCE

## 2018-05-31 DIAGNOSIS — Z23 Encounter for immunization: Secondary | ICD-10-CM | POA: Diagnosis not present

## 2018-08-05 ENCOUNTER — Telehealth: Payer: Self-pay | Admitting: Family Medicine

## 2018-08-05 NOTE — Telephone Encounter (Signed)
Please advise if the transfer can be approved due to provider preference limits for scheduling.  Copied from CRM [phone]. Topic: Appointment Scheduling - Scheduling Inquiry for Clinic >> Aug 05, 2018 11:44 AM Herby AbrahamJohnson, Shiquita C wrote: Reason for CRM: pt would like to know if Dr. Durene CalHunter would take her on as a new patient? Pt says that her husband and children are current patients of his.   Please advise.    CB: [phone]

## 2018-08-05 NOTE — Telephone Encounter (Signed)
Yes willing to accept.

## 2018-08-06 NOTE — Telephone Encounter (Signed)
Please contact patient to schedule TOC appointment, no approval needed as previous provider has retired.

## 2018-08-06 NOTE — Telephone Encounter (Signed)
Called patient and left a message to call back and schedule

## 2018-09-17 ENCOUNTER — Encounter: Payer: PRIVATE HEALTH INSURANCE | Admitting: Family Medicine

## 2019-03-19 ENCOUNTER — Other Ambulatory Visit: Payer: Self-pay | Admitting: Family Medicine

## 2019-03-19 DIAGNOSIS — Z1231 Encounter for screening mammogram for malignant neoplasm of breast: Secondary | ICD-10-CM

## 2019-04-16 ENCOUNTER — Other Ambulatory Visit: Payer: Self-pay

## 2019-04-16 ENCOUNTER — Ambulatory Visit
Admission: RE | Admit: 2019-04-16 | Discharge: 2019-04-16 | Disposition: A | Discharge status: Home / Self Care | Payer: PRIVATE HEALTH INSURANCE | Source: Ambulatory Visit | Attending: Family Medicine | Admitting: Family Medicine

## 2019-04-16 DIAGNOSIS — Z1231 Encounter for screening mammogram for malignant neoplasm of breast: Secondary | ICD-10-CM

## 2019-05-20 ENCOUNTER — Ambulatory Visit: Payer: PRIVATE HEALTH INSURANCE

## 2019-05-21 ENCOUNTER — Encounter: Payer: Self-pay | Admitting: Family Medicine

## 2019-05-21 ENCOUNTER — Other Ambulatory Visit: Payer: Self-pay

## 2019-05-21 ENCOUNTER — Ambulatory Visit (INDEPENDENT_AMBULATORY_CARE_PROVIDER_SITE_OTHER): Payer: PRIVATE HEALTH INSURANCE

## 2019-05-21 DIAGNOSIS — Z23 Encounter for immunization: Secondary | ICD-10-CM

## 2019-05-30 ENCOUNTER — Ambulatory Visit: Payer: PRIVATE HEALTH INSURANCE

## 2019-05-30 ENCOUNTER — Other Ambulatory Visit: Payer: Self-pay

## 2019-06-17 ENCOUNTER — Other Ambulatory Visit: Payer: Self-pay

## 2019-06-17 ENCOUNTER — Telehealth: Payer: Self-pay | Admitting: Obstetrics & Gynecology

## 2019-06-17 NOTE — Telephone Encounter (Signed)
Per review of Epic, AEX rescheduled to 08/14/19 at 11am.  Encounter closed.

## 2019-06-17 NOTE — Telephone Encounter (Signed)
Covid prescreen positive. Patient was at a church dinner Thursday 06/12/19 and was notified 10/17 that a server had tested positive. Was told not to get a test for four days so she has not been tested yet. No symptoms.

## 2019-06-19 ENCOUNTER — Encounter: Payer: PRIVATE HEALTH INSURANCE | Admitting: Obstetrics & Gynecology

## 2019-08-14 ENCOUNTER — Encounter: Payer: PRIVATE HEALTH INSURANCE | Admitting: Obstetrics & Gynecology

## 2019-08-14 ENCOUNTER — Encounter

## 2020-06-23 ENCOUNTER — Other Ambulatory Visit: Payer: Self-pay

## 2020-06-23 ENCOUNTER — Ambulatory Visit (INDEPENDENT_AMBULATORY_CARE_PROVIDER_SITE_OTHER): Payer: PRIVATE HEALTH INSURANCE

## 2020-06-23 DIAGNOSIS — Z23 Encounter for immunization: Secondary | ICD-10-CM | POA: Diagnosis not present

## 2021-04-14 IMAGING — MG DIGITAL SCREENING BILATERAL MAMMOGRAM WITH TOMO AND CAD
8 series · 8 of 24 positions shown · non-contrast
Comparison: Previous exam(s).

CLINICAL DATA: Screening.

EXAM:
DIGITAL SCREENING BILATERAL MAMMOGRAM WITH TOMO AND CAD

[L CC synth-2D]
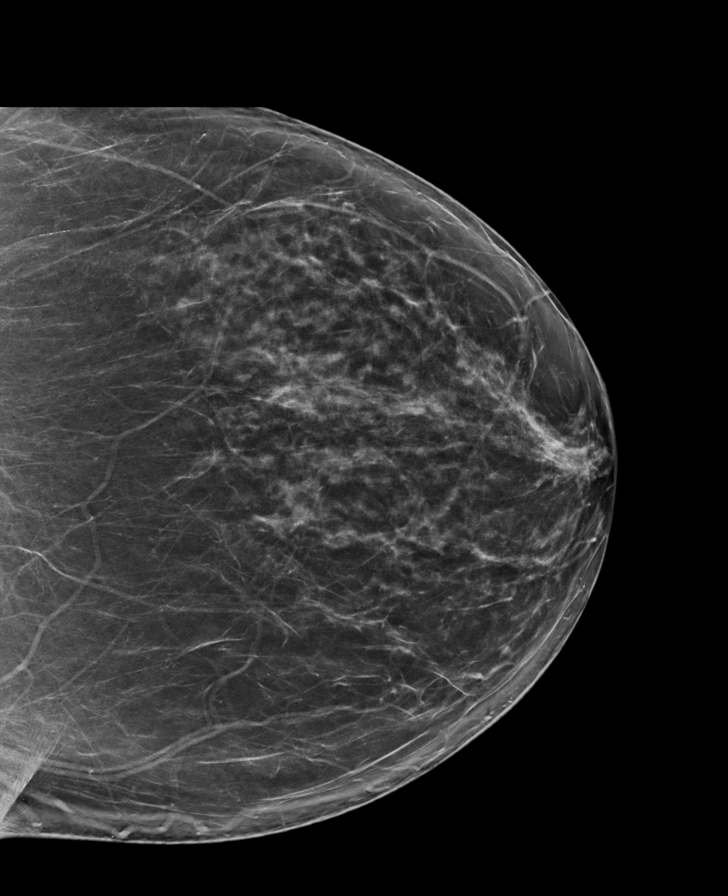

[R MLO synth-2D]
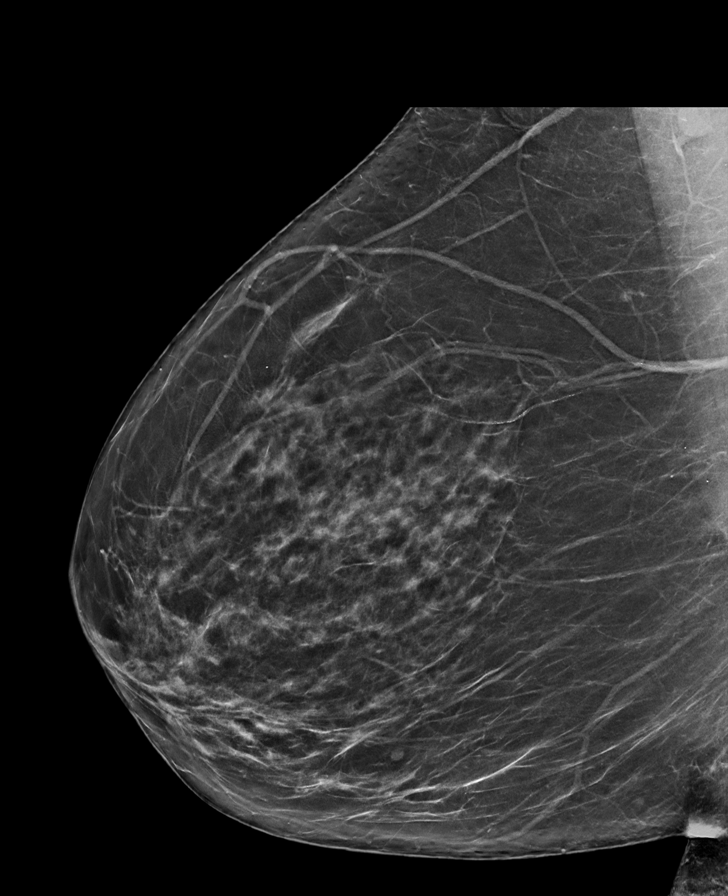

[R CC synth-2D]
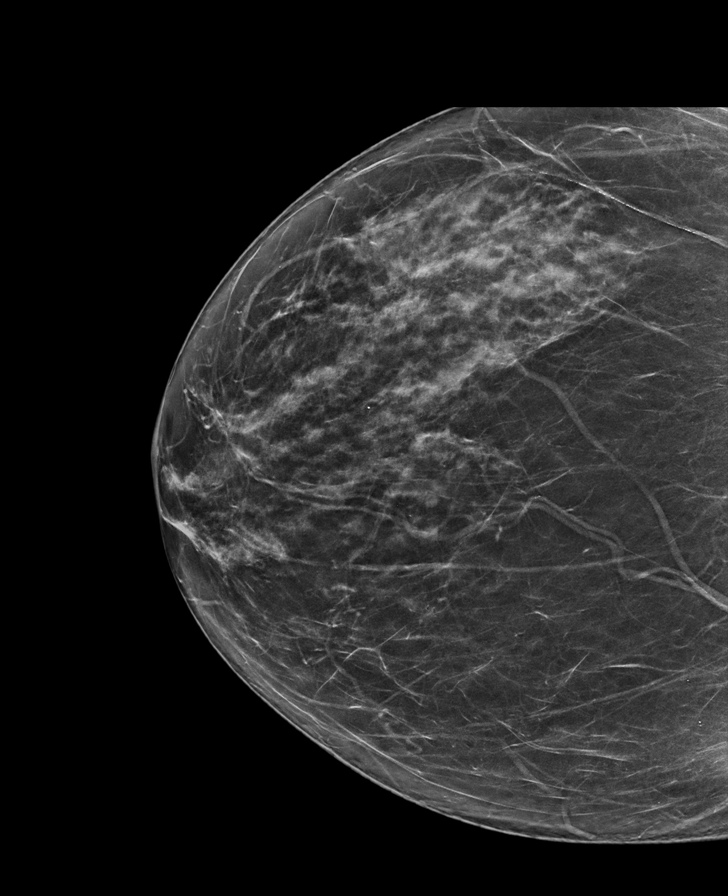

[L MLO synth-2D]
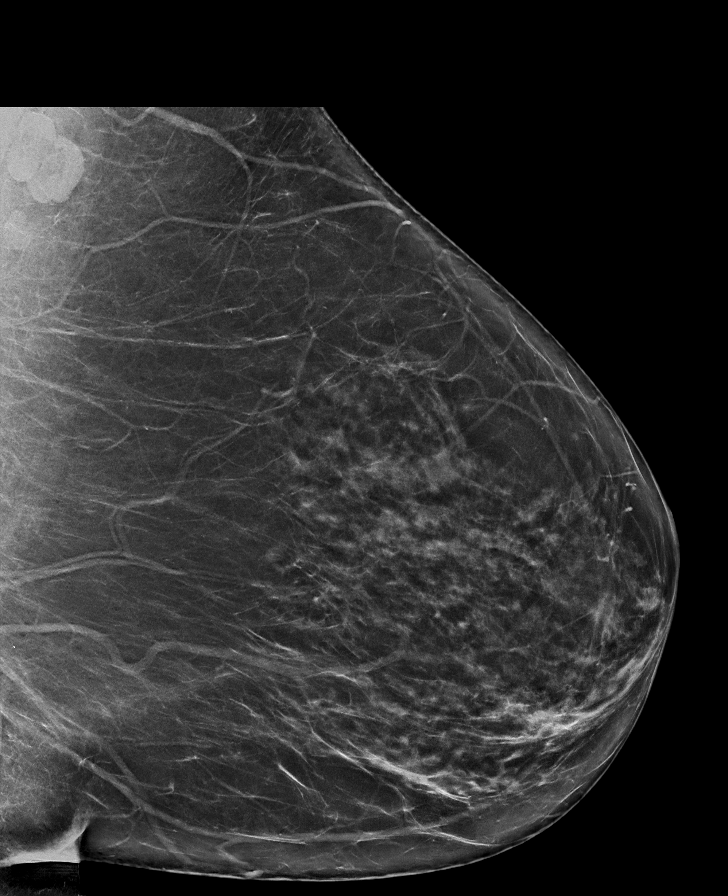

[L MLO tomo · tomo slice 43/85.0]
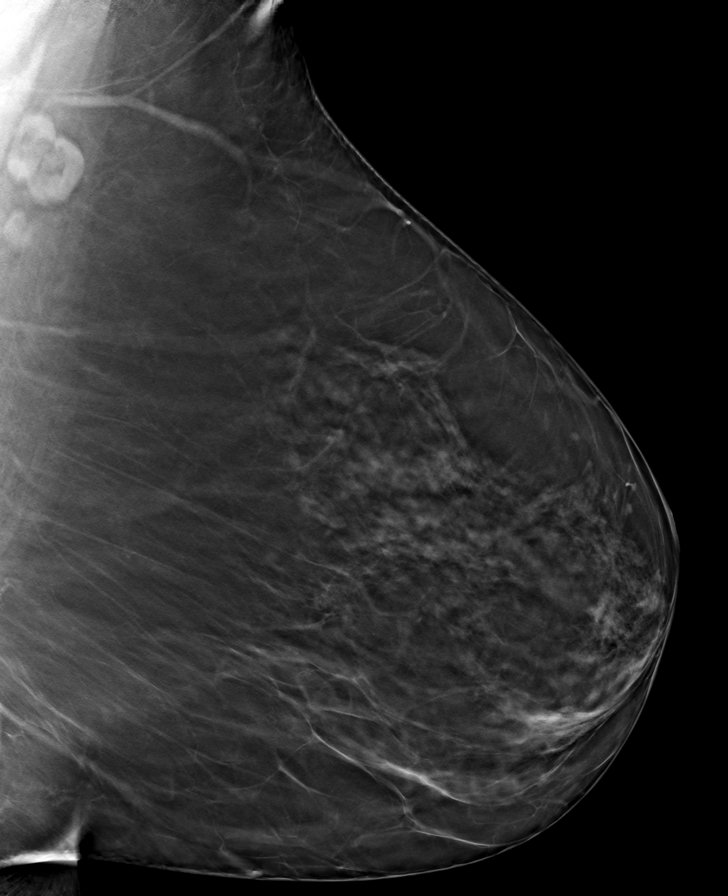

[L CC tomo · tomo slice 41/80.0]
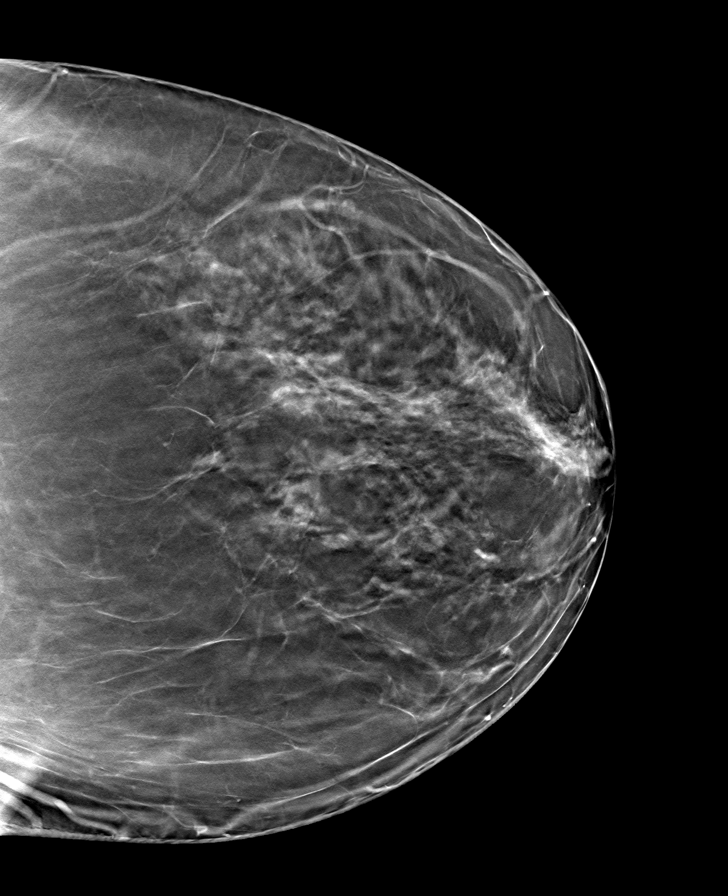

[R CC tomo · tomo slice 38/75.0]
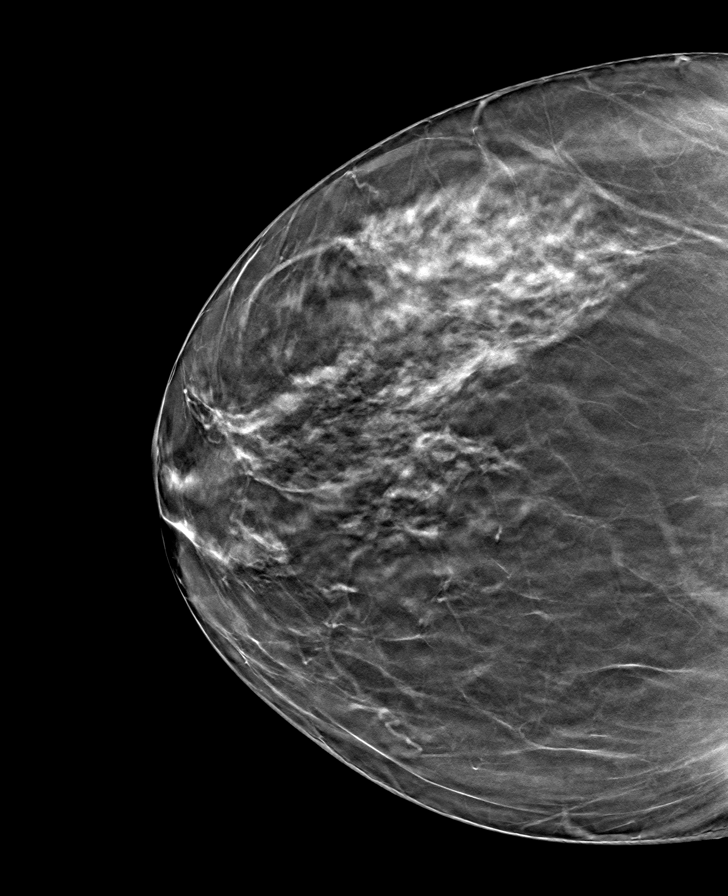

[R MLO tomo · tomo slice 40/79.0]
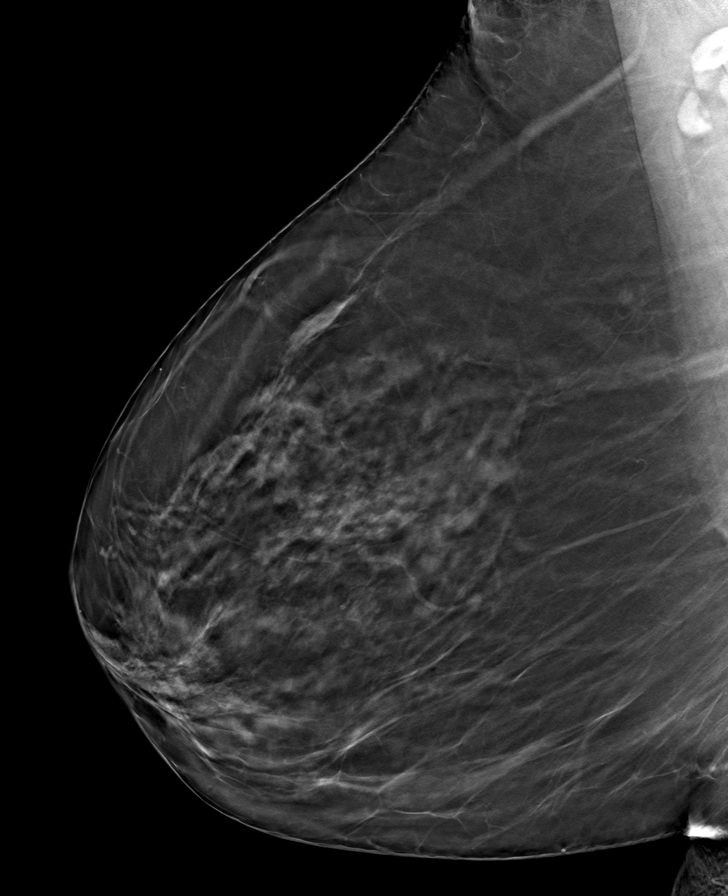

[8 of 24 positions shown; findings below may reference images not displayed]

ACR Breast Density Category b: There are scattered areas of
fibroglandular density.
FINDINGS: There are no findings suspicious for malignancy. Images were
processed with CAD.
IMPRESSION: No mammographic evidence of malignancy. A result letter of this
screening mammogram will be mailed directly to the patient.

RECOMMENDATION:
Screening mammogram in one year. (Code:[id number])

BI-RADS CATEGORY  1: Negative.

## 2022-12-11 ENCOUNTER — Ambulatory Visit: Payer: PRIVATE HEALTH INSURANCE | Admitting: Family Medicine

## 2023-01-25 ENCOUNTER — Ambulatory Visit: Payer: PRIVATE HEALTH INSURANCE | Admitting: Family Medicine

## 2023-02-01 ENCOUNTER — Ambulatory Visit: Payer: PRIVATE HEALTH INSURANCE | Admitting: Family Medicine

## 2023-03-23 ENCOUNTER — Ambulatory Visit: Payer: PRIVATE HEALTH INSURANCE | Admitting: Family Medicine

## 2023-08-03 ENCOUNTER — Ambulatory Visit: Payer: PRIVATE HEALTH INSURANCE | Admitting: Family Medicine

## 2023-08-30 ENCOUNTER — Ambulatory Visit: Payer: PRIVATE HEALTH INSURANCE | Admitting: Family Medicine

## 2023-12-04 ENCOUNTER — Other Ambulatory Visit: Payer: Self-pay | Admitting: Family Medicine

## 2023-12-04 ENCOUNTER — Telehealth: Payer: Self-pay | Admitting: *Deleted

## 2023-12-04 DIAGNOSIS — Z1231 Encounter for screening mammogram for malignant neoplasm of breast: Secondary | ICD-10-CM

## 2023-12-04 NOTE — Telephone Encounter (Signed)
 Pt has asked previously in 2019 to become a pt of [facility]. Pt has since canceled their last 8 appointments here. She is asking again. Spouse is a current pt.  Please advise.

## 2023-12-04 NOTE — Telephone Encounter (Signed)
 Copied from CRM [id number]. Topic: Appointments - Scheduling Inquiry for Clinic >> Dec 04, 2023  9:44 AM [place] J wrote: Reason for CRM: Patient would like to schedule with Dr. Durene Cal due to special accomodation he's agreed to with accepting her as a new patient.  I let patient know to expect a call back from the clinic.

## 2023-12-04 NOTE — Telephone Encounter (Signed)
 May book as 20 minute slot at 4 pm one day so we do not lose 2 slots for this if she cancels again but please tell her will not be able to accommodate if she cancels again

## 2023-12-06 ENCOUNTER — Encounter: Payer: Self-pay | Admitting: Family Medicine

## 2023-12-06 ENCOUNTER — Ambulatory Visit (INDEPENDENT_AMBULATORY_CARE_PROVIDER_SITE_OTHER): Admitting: Family Medicine

## 2023-12-06 VITALS — BP 138/88 | HR 79 | Temp 98.4°F | Ht 68.5 in | Wt 196.2 lb

## 2023-12-06 DIAGNOSIS — Z124 Encounter for screening for malignant neoplasm of cervix: Secondary | ICD-10-CM | POA: Diagnosis not present

## 2023-12-06 DIAGNOSIS — D649 Anemia, unspecified: Secondary | ICD-10-CM

## 2023-12-06 DIAGNOSIS — Z23 Encounter for immunization: Secondary | ICD-10-CM | POA: Diagnosis not present

## 2023-12-06 DIAGNOSIS — R221 Localized swelling, mass and lump, neck: Secondary | ICD-10-CM | POA: Diagnosis not present

## 2023-12-06 DIAGNOSIS — Z1211 Encounter for screening for malignant neoplasm of colon: Secondary | ICD-10-CM | POA: Diagnosis not present

## 2023-12-06 LAB — CBC WITH DIFFERENTIAL/PLATELET
Basophils Absolute: 0.1 10*3/uL (ref 0.0–0.1)
Basophils Relative: 1.3 % (ref 0.0–3.0)
Eosinophils Absolute: 0.2 10*3/uL (ref 0.0–0.7)
Eosinophils Relative: 3.3 % (ref 0.0–5.0)
HCT: 40 % (ref 36.0–46.0)
Hemoglobin: 13.3 g/dL (ref 12.0–15.0)
Lymphocytes Relative: 15.9 % (ref 12.0–46.0)
Lymphs Abs: 0.9 10*3/uL (ref 0.7–4.0)
MCHC: 33.2 g/dL (ref 30.0–36.0)
MCV: 93.6 fl (ref 78.0–100.0)
Monocytes Absolute: 0.4 10*3/uL (ref 0.1–1.0)
Monocytes Relative: 7.3 % (ref 3.0–12.0)
Neutro Abs: 4 10*3/uL (ref 1.4–7.7)
Neutrophils Relative %: 72.2 % (ref 43.0–77.0)
Platelets: 255 10*3/uL (ref 150.0–400.0)
RBC: 4.27 Mil/uL (ref 3.87–5.11)
RDW: 13.6 % (ref 11.5–15.5)
WBC: 5.6 10*3/uL (ref 4.0–10.5)

## 2023-12-06 LAB — COMPREHENSIVE METABOLIC PANEL WITH GFR
ALT: 28 U/L (ref 0–35)
AST: 31 U/L (ref 0–37)
Albumin: 4.8 g/dL (ref 3.5–5.2)
Alkaline Phosphatase: 73 U/L (ref 39–117)
BUN: 17 mg/dL (ref 6–23)
CO2: 25 meq/L (ref 19–32)
Calcium: 9.6 mg/dL (ref 8.4–10.5)
Chloride: 103 meq/L (ref 96–112)
Creatinine, Ser: 0.73 mg/dL (ref 0.40–1.20)
GFR: 85.9 mL/min (ref >60.00)
Glucose, Bld: 98 mg/dL (ref 70–99)
Potassium: 4 meq/L (ref 3.5–5.1)
Sodium: 139 meq/L (ref 135–145)
Total Bilirubin: 0.4 mg/dL (ref 0.2–1.2)
Total Protein: 7.2 g/dL (ref 6.0–8.3)

## 2023-12-06 LAB — IBC + FERRITIN
Ferritin: 128.8 ng/mL (ref 10.0–291.0)
Iron: 80 ug/dL (ref 42–145)
Saturation Ratios: 24.8 % (ref 20.0–50.0)
TIBC: 322 ug/dL (ref 250.0–450.0)
Transferrin: 230 mg/dL (ref 212.0–360.0)

## 2023-12-06 LAB — SEDIMENTATION RATE: Sed Rate: 6 mm/h (ref 0–30)

## 2023-12-06 LAB — C-REACTIVE PROTEIN: CRP: <1 mg/dL (ref 0.5–20.0)

## 2023-12-06 NOTE — Patient Instructions (Addendum)
 Urgent referral to ENT- let me know if you haven't heard by Monday or Tuesday  Tetanus, Diphtheria, and Pertussis (Tdap) today  Send me the dates of Prevnar 20 (pneumonia) and shingrix from pharmacy  please let us know if you have not received your Cologuard within 3 weeks-please complete after you receive  We have placed a referral for you today to Dr. Rondel Baton office with GYN- please call their # if you do not hear within a week (may be listed below or you may see mychart message within a few days with #).   Please stop by lab before you go If you have mychart- we will send your results within 3 business days of [place] receiving them.  If you do not have mychart- we will call you about results within 5 business days of [place] receiving them.  *please also note that you will see labs on mychart as soon as they post. I will later go in and write notes on them- will say "notes from Dr. Durene Cal"   Recommended follow up: Return in about 4 months (around 04/06/2024) for physical or sooner if needed.Schedule b4 you leave.

## 2023-12-06 NOTE — Progress Notes (Signed)
 Phone: [phone]   Subjective:  Patient presents today to establish care.  Prior patient of Dr. Tawanna Cooler. Last physical in 2012 Chief Complaint  Patient presents with   Establish Care    Lump on the rt side of neck which has been there since 11/24   ROS- some knee pain at times, history anemia, bump under right jaw/chin.   See problem oriented charting  The following were reviewed and entered/updated in epic: Past Medical History:  Diagnosis Date   Anemia    since childhood   Ileus following gastrointestinal surgery (HCC) 04/22/2012   Muscle strain 04/22/2012   Patient Active Problem List   Diagnosis Date Noted   History of appendectomy, 04/14/2012 04/26/2012    Priority: Low   Shortness of breath 02/21/2012    Priority: Low   Enlarged lymph nodes 11/22/2009   Past Surgical History:  Procedure Laterality Date   BACK SURGERY     1980- low back ruptured disk   LAPAROSCOPIC APPENDECTOMY  04/14/2012   Procedure: APPENDECTOMY LAPAROSCOPIC;  Surgeon: Kandis Cocking, MD;  Location: [facility];  Service: General;  Laterality: N/A;   TONSILLECTOMY     1980s    Family History  Problem Relation Age of Onset   Stroke Mother    Lymphoma Father    Diabetes Brother    Hyperlipidemia Brother    Kidney disease Brother        dehydration and stopped dm meds etc- thankfully had recovery   Depression Brother    Drug abuse Brother    Stroke Maternal Grandmother    Stroke Paternal Grandfather    Drug abuse Daughter    Depression Daughter    Bipolar disorder Daughter    Drug abuse Son    Depression Son     Medications- reviewed and updated No current outpatient medications on file.   No current facility-administered medications for this visit.    Allergies-reviewed and updated No Known Allergies  Social History   Social History Narrative   Married. 3 children- 40, 38, 25 in 2025- 2 sons and a daughter. No grandkids yet.       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 Oropharynx normal. TM normal. Under right jaw about midway there is a 5 x 5 cm mass- with 2 x 2 cm similar mass anterior to and below it (3 areas of concern total)  Eyes: sclera and lids normal, PERRLA Neck: no thyromegaly, no cervical lymphadenopathy CV: RRR no murmurs rubs or gallops Lungs: CTAB no crackles, wheeze, rhonchi Abdomen: soft/nontender/nondistended/normal bowel sounds. No rebound or guarding.  Ext: no edema Skin: warm, dry Neuro:  normal gait, normal reflexes   Assessment and Plan:   # Right neck lump S:noted since July 16 2023. Noted lump and has grown in size gradually. Dad with lymphoma history so that really concerns her. No pain. No redness or itchiness. No personal history of cancer. No trouble swallowing - she is able to complete 4 mets of activity without chest pain or shortness of breath . 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  -did have lymphadenopathy in abdomen but was related to appendecitis in 2013- reports saw oncology and was cleared at that time (I can't find that note)  A/P: 68 year old female with neck mass (possible submandibular lymphadenopathy) for about 5 months that seems to be growing in size gradually- on exam seems to have 3 areas- one that is 5x5 cm and other two close at 2 x 2 cm- I want to get some basic bloodwork as well as do an urgent referral to ENT for consideration of biopsy vs removal.  - she is active and can complete 4 metastases so would not need further cardiac clearance   #Anemia S:reports long term history since childhood. Persisted even after menopause  as far as she's aware. does not remember being told iron was low.  Takes iron twice a week -history of menarche age 43.  Menopause at 44. Cycles were not particularly heavy A/P: long term issues- check iron stores today- can continue twice weekly iron for now - wonder if other underlying etiology such as a thalassemia   #Health maintenance -had flu shot in 2024 -shingles in 2023 -pneumonia in 2023 -mammogram schduled this year- last in 2020 -[facility] - never had colonoscopy - no family history colon cancer polyps   Recommended follow up: Return in about 4 months (around 04/06/2024) for physical or sooner if needed.Schedule b4 you leave. Future Appointments  Date Time Provider Department Center  12/18/2023 12:40 PM [facility]   Return precautions advised. Tana Conch, MD

## 2023-12-17 ENCOUNTER — Encounter (HOSPITAL_BASED_OUTPATIENT_CLINIC_OR_DEPARTMENT_OTHER): Payer: Self-pay | Admitting: Certified Nurse Midwife

## 2023-12-17 ENCOUNTER — Ambulatory Visit (INDEPENDENT_AMBULATORY_CARE_PROVIDER_SITE_OTHER): Admitting: Certified Nurse Midwife

## 2023-12-17 VITALS — BP 151/77 | HR 74 | Ht 68.5 in | Wt 201.4 lb

## 2023-12-17 DIAGNOSIS — Z01419 Encounter for gynecological examination (general) (routine) without abnormal findings: Secondary | ICD-10-CM | POA: Diagnosis not present

## 2023-12-17 NOTE — Progress Notes (Signed)
 68 y.o. G35P3003 Married White or Caucasian female here for annual exam.    No LMP recorded. (Menstrual status: Perimenopausal).          Sexually active: not currently  The current method of family planning is post menopausal status.     Exercising: pt trying to start exercising again   Smoker:  no  Health Maintenance: Pap:  Pt unsure when last pap smear was, possibly 2011 History of abnormal Pap:  no MMG:  Last mammogram many years ago Colonoscopy:  UTD per pt BMD:   Will consider Screening Labs: PCP   reports that she has never smoked. She has never used smokeless tobacco. She reports that she does not drink alcohol and does not use drugs.  Past Medical History:  Diagnosis Date   Anemia    since childhood   Ileus following gastrointestinal surgery (HCC) 04/22/2012   Muscle strain 04/22/2012    Past Surgical History:  Procedure Laterality Date   BACK SURGERY     1980- low back ruptured disk   LAPAROSCOPIC APPENDECTOMY  04/14/2012   Procedure: APPENDECTOMY LAPAROSCOPIC;  Surgeon: Thayne Fine, MD;  Location: [facility];  Service: General;  Laterality: N/A;   TONSILLECTOMY     1980s    No current outpatient medications on file.   No current facility-administered medications for this visit.    Family History  Problem Relation Age of Onset   Stroke Mother    Lymphoma Father    Diabetes Brother    Hyperlipidemia Brother    Kidney disease Brother        dehydration and stopped dm meds etc- thankfully had recovery   Depression Brother    Drug abuse Brother    Stroke Maternal Grandmother    Stroke Paternal Grandfather    Drug abuse Daughter    Depression Daughter    Bipolar disorder Daughter    Drug abuse Son    Depression Son     ROS: Constitutional: negative Genitourinary:negative  Exam:   BP (!) 151/77 (BP Location: Left Arm, Patient Position: Sitting, Cuff Size: Large)   Pulse 74   Ht 5' 8.5" (1.74 m) Comment: Reported  Wt 201 lb 6.4 oz (91.4 kg)    BMI 30.18 kg/m   Height: 5' 8.5" (174 cm) (Reported)  General appearance: alert, cooperative and appears stated age Head: Normocephalic, without obvious abnormality Lungs: clear to auscultation bilaterally Breasts: normal appearance, no masses or tenderness, Inspection negative, No nipple retraction or dimpling, No nipple discharge or bleeding, No axillary or supraclavicular adenopathy, Normal to palpation without dominant masses Heart: regular rate and rhythm Abdomen: soft, non-tender; bowel sounds normal; no masses,  no organomegaly Extremities: extremities normal, atraumatic, no cyanosis or edema Skin: Skin color, texture, turgor normal. No rashes or lesions Lymph nodes: Cervical, supraclavicular, and axillary nodes normal. No abnormal inguinal nodes palpated Neurologic: Grossly normal   Pelvic: External genitalia:  no lesions              Urethra:  normal appearing urethra with no masses, tenderness or lesions              Bartholins and Skenes: normal                 Vagina: normal appearing vagina with normal color and no discharge, no lesions              Cervix: no bleeding following Pap, no cervical motion tenderness, and no lesions  Pap taken: Yes.   Bimanual Exam:  Uterus:  normal size, contour, position, consistency, mobility, non-tender              Adnexa: no mass, fullness, tenderness               Rectovaginal: Confirms               Anus:  normal sphincter tone, no lesions  Chaperone,  CMA, was present for exam.  Assessment/Plan:  1. Encounter for annual routine gynecological examination (Primary) - Continue annual screening mammograms and self breast awareness - Routine pap with HPV CoTesting collected - Pt UTD on Colon Cancer Screening  RTO 1 year for annual gyn exam and prn if issues arise.  Yolanda Hence

## 2023-12-18 ENCOUNTER — Ambulatory Visit: Payer: PRIVATE HEALTH INSURANCE

## 2023-12-18 ENCOUNTER — Encounter (HOSPITAL_BASED_OUTPATIENT_CLINIC_OR_DEPARTMENT_OTHER): Payer: Self-pay | Admitting: Obstetrics & Gynecology

## 2023-12-18 ENCOUNTER — Ambulatory Visit (INDEPENDENT_AMBULATORY_CARE_PROVIDER_SITE_OTHER): Admitting: Otolaryngology

## 2023-12-18 ENCOUNTER — Other Ambulatory Visit (HOSPITAL_COMMUNITY)
Admission: RE | Admit: 2023-12-18 | Discharge: 2023-12-18 | Disposition: A | Discharge status: Home / Self Care | Source: Ambulatory Visit | Attending: Obstetrics & Gynecology | Admitting: Obstetrics & Gynecology

## 2023-12-18 ENCOUNTER — Encounter (INDEPENDENT_AMBULATORY_CARE_PROVIDER_SITE_OTHER): Payer: Self-pay

## 2023-12-18 ENCOUNTER — Ambulatory Visit (HOSPITAL_BASED_OUTPATIENT_CLINIC_OR_DEPARTMENT_OTHER): Admitting: Obstetrics & Gynecology

## 2023-12-18 ENCOUNTER — Ambulatory Visit (HOSPITAL_COMMUNITY)
Admission: RE | Admit: 2023-12-18 | Discharge: 2023-12-18 | Disposition: A | Discharge status: Home / Self Care | Source: Ambulatory Visit | Attending: Otolaryngology | Admitting: Otolaryngology

## 2023-12-18 VITALS — BP 150/89 | HR 86 | Ht 68.5 in | Wt 200.0 lb

## 2023-12-18 VITALS — BP 127/81 | HR 78 | Ht 68.5 in | Wt 200.8 lb

## 2023-12-18 DIAGNOSIS — Z124 Encounter for screening for malignant neoplasm of cervix: Secondary | ICD-10-CM | POA: Insufficient documentation

## 2023-12-18 DIAGNOSIS — N812 Incomplete uterovaginal prolapse: Secondary | ICD-10-CM | POA: Diagnosis not present

## 2023-12-18 DIAGNOSIS — R221 Localized swelling, mass and lump, neck: Secondary | ICD-10-CM | POA: Diagnosis present

## 2023-12-18 DIAGNOSIS — T192XXD Foreign body in vulva and vagina, subsequent encounter: Secondary | ICD-10-CM

## 2023-12-18 LAB — COLOGUARD: COLOGUARD: NEGATIVE

## 2023-12-18 MED ORDER — IOHEXOL 300 MG/ML  SOLN
75.0000 mL | Freq: Once | INTRAMUSCULAR | Status: AC | PRN
  Administered 2023-12-18: 75 mL via INTRAVENOUS

## 2023-12-18 NOTE — Patient Instructions (Addendum)
 I have ordered an imaging study for you to complete prior to your next visit. Please call [facility] Scheduling at [phone] to schedule your imaging if you have not received a call within 24 hours. If you are unable to complete your imaging study prior to your next scheduled visit please call our office to let us know.

## 2023-12-18 NOTE — Progress Notes (Signed)
 Dear Dr. Arlene Ben, Here is my assessment for our mutual patient, Tracy Bradley. Thank you for allowing me the opportunity to care for your patient. Please do not hesitate to contact me should you have any other questions. Sincerely, Dr. Milon Aloe  Otolaryngology Clinic Note Referring provider: Dr. Arlene Ben HPI:  Tracy Bradley is a 68 y.o. female kindly referred by Dr. Arlene Ben for evaluation of right neck mass  Initial visit (11/2023): Patient reports: right neck mass popped up suddenly in Nov 2024, no antecedent event including URI. She reports it is growing and has noticed other neck masses come up over past couple of months. Not painful, but now slightly uncomfortable/pressure.  Patient otherwise denies: - dysphagia, odynophagia, aspiration episodes or PNA, need for Heimlich, unintentional weight loss, fevers/chills/night sweats - changes in voice, shortness of breath, hemoptysis, tobacco or significant alcohol history, ear pain  No history of facial skin cancers  H&N Surgery: tonsillectomy (as adult) Personal or FHx of bleeding dz or anesthesia difficulty: no  GLP-1: no AP/AC: no  Tobacco: no. Alcohol: no. Occupation: Agricultural consultant at [employer], prior Administrator.  PMHx: Anemia  Independent Review of Additional Tests or Records:  Dr. Arlene Ben (12/06/2023): Right neck mass noted in Nov 2024, grown in size, popped up suddenly; Dx: Right neck mass, Rx: Ref to ENT CBC, CRP, Sed, and CMP reviewed 12/06/2023: BUN/Cr 17/0.73; WBC 5.6, Plt 255, CRP/Sed rate normal  PMH/Meds/All/SocHx/FamHx/ROS:   Past Medical History:  Diagnosis Date   Anemia    since childhood   Ileus following gastrointestinal surgery (HCC) 04/22/2012   Muscle strain 04/22/2012     Past Surgical History:  Procedure Laterality Date   BACK SURGERY     1980- low back ruptured disk   LAPAROSCOPIC APPENDECTOMY  04/14/2012   Procedure: APPENDECTOMY LAPAROSCOPIC;  Surgeon: Thayne Fine, MD;  Location: [facility];   Service: General;  Laterality: N/A;   TONSILLECTOMY     1980s    Family History  Problem Relation Age of Onset   Stroke Mother    Lymphoma Father    Diabetes Brother    Hyperlipidemia Brother    Kidney disease Brother        dehydration and stopped dm meds etc- thankfully had recovery   Depression Brother    Drug abuse Brother    Stroke Maternal Grandmother    Stroke Paternal Grandfather    Drug abuse Daughter    Depression Daughter    Bipolar disorder Daughter    Drug abuse Son    Depression Son      Social Connections: Not on file     No current outpatient medications on file.   Physical Exam:   BP (!) 150/89 (BP Location: Left Arm, Patient Position: Sitting, Cuff Size: Normal)   Pulse 86   Ht 5' 8.5" (1.74 m)   Wt 200 lb (90.7 kg)   SpO2 95%   BMI 29.97 kg/m   Salient findings:  CN II-XII intact  Bilateral EAC clear and TM intact with well pneumatized middle ear spaces Anterior rhinoscopy: Septum relatively midline; bilateral inferior turbinates without significant hypertrophy No lesions of oral cavity/oropharynx; dentition fair; tonsils absent, no masses noted over palpable tongue base No obviously palpable thyromegaly; right neck submandibular lymph node, and also with right level 2/3/4 nodes (all seem mobile) No respiratory distress or stridor  Seprately Identifiable Procedures:  Prior to initiating any procedures, risks/benefits/alternatives were explained to the patient and verbal consent obtained. Procedure Note Pre-procedure diagnosis:  Right neck mass,  rule out primary site, concern for carcinoma Post-procedure diagnosis: Same Procedure: Transnasal Fiberoptic Laryngoscopy, CPT 31575 - Mod 25 Indication: see above Complications: None apparent EBL: 0 mL  The procedure was undertaken to further evaluate the patient's complaints above, with mirror exam inadequate for appropriate examination due to gag reflex and poor patient tolerance  Procedure:   Patient was identified as correct patient. Verbal consent was obtained. The nose was sprayed with oxymetazoline and 4% lidocaine . The The flexible laryngoscope was passed through the nose to view the nasal cavity, pharynx (oropharynx, hypopharynx) and larynx.  The larynx was examined at rest and during multiple phonatory tasks. Documentation was obtained and reviewed with patient. The scope was removed. The patient tolerated the procedure well.  Findings: The nasal cavity and nasopharynx did not reveal any masses or lesions, mucosa appeared to be without obvious lesions. The tongue base, pharyngeal walls, piriform sinuses, vallecula, epiglottis and postcricoid region are normal in appearance without obvious masses. The visualized portion of the subglottis and proximal trachea is widely patent. The vocal folds are mobile bilaterally. There are no lesions on the free edge of the vocal folds nor elsewhere in the larynx worrisome for malignancy.    Electronically signed by: Evelina Hippo, MD 12/18/2023 12:07 PM   Impression & Plans:  Tracy Bradley is a 68 y.o. female with:  1. Mass of right side of neck    Clear multiple masses over right neck; Otherwise essentially asymptomatic.  We discussed DDX is wide and includes benign and malignant etiologies though do suspect carcinoma here - thyroid, lymphoma v/s SCCa Needs further workup - CT Neck urgently; did order US  guided core needle biopsy F/u 2 weeks   See below regarding exact medications prescribed this encounter including dosages and route: No orders of the defined types were placed in this encounter.     Thank you for allowing me the opportunity to care for your patient. Please do not hesitate to contact me should you have any other questions.  Sincerely, Milon Aloe, MD Otolaryngologist (ENT), [facility] Phone: [phone] Fax: [phone]  12/18/2023, 12:07 PM   MDM:  Level 4 - 99204 Complexity/Problems  addressed: mod - new problem, unknown prognosis or diagnosis Data complexity: mod - independent review of notes, labs, testing - Morbidity: mod  - Prescription Drug prescribed or managed: no

## 2023-12-19 ENCOUNTER — Ambulatory Visit
Admission: RE | Admit: 2023-12-19 | Discharge: 2023-12-19 | Disposition: A | Discharge status: Home / Self Care | Payer: PRIVATE HEALTH INSURANCE | Source: Ambulatory Visit | Attending: Family Medicine | Admitting: Family Medicine

## 2023-12-19 ENCOUNTER — Telehealth (INDEPENDENT_AMBULATORY_CARE_PROVIDER_SITE_OTHER): Payer: Self-pay | Admitting: Otolaryngology

## 2023-12-19 ENCOUNTER — Encounter: Payer: Self-pay | Admitting: Family Medicine

## 2023-12-19 DIAGNOSIS — R221 Localized swelling, mass and lump, neck: Secondary | ICD-10-CM

## 2023-12-19 DIAGNOSIS — Z1231 Encounter for screening mammogram for malignant neoplasm of breast: Secondary | ICD-10-CM

## 2023-12-19 NOTE — Telephone Encounter (Signed)
 Changed to [place] as other hospitals do not do core FNA of neck nodules

## 2023-12-19 NOTE — Progress Notes (Unsigned)
 Roxie Cord, MD  Derrell Flight Approved for US  guided core biopsy of RIGHT cervical or subclavian lymphadenopathy.  Mets vs. Lymphoma.  HKM       Previous Messages    ----- Message ----- From: Derrell Flight Sent: 12/19/2023  10:16 AM EDT To: Derrell Flight; Ir Procedure Requests Subject: US  FNA SOFT TISSUE                            Procedure :US  FNA SOFT TISSUE  Reason :neck nodes, rule out carcinoma, request core biopsy Dx: Mass of right side of neck  History :CT Soft Tissue Neck W Contrast  Provider:  Evelina Hippo, MD  Provider contact : [phone]

## 2023-12-20 LAB — CYTOLOGY - PAP
Comment: NEGATIVE
Diagnosis: NEGATIVE
High risk HPV: NEGATIVE

## 2023-12-21 ENCOUNTER — Encounter (HOSPITAL_BASED_OUTPATIENT_CLINIC_OR_DEPARTMENT_OTHER): Payer: Self-pay | Admitting: Obstetrics & Gynecology

## 2023-12-21 DIAGNOSIS — N812 Incomplete uterovaginal prolapse: Secondary | ICD-10-CM | POA: Insufficient documentation

## 2023-12-21 NOTE — Progress Notes (Signed)
 GYNECOLOGY  VISIT  CC:   foreign body removal  HPI: 68 y.o. G9P3003 Married White or Caucasian female here for attempted removal of vaginal foreign body.  She has a ball she placed in the vagina over a year ago that she cannot remove.  She placed this for prolapse symptoms.  It has worked well to control symptoms but she would like it removed.  Attempt was made by Hezzie Loupe, CNM, yesterday.  She is here again today for attempted removal.  Denies any vaginal bleeding or discharge.   Past Medical History:  Diagnosis Date   Anemia    since childhood   Ileus following gastrointestinal surgery (HCC) 04/22/2012   Muscle strain 04/22/2012    MEDS:   No current outpatient medications on file prior to visit.   No current facility-administered medications on file prior to visit.    ALLERGIES: Patient has no known allergies.  SH:  married, non smoker  Review of Systems  Constitutional: Negative.   Genitourinary: Negative.     PHYSICAL EXAMINATION:    BP 127/81 (BP Location: Left Arm, Patient Position: Sitting, Cuff Size: Normal)   Pulse 78   Ht 5' 8.5" (1.74 m)   Wt 200 lb 12.8 oz (91.1 kg)   BMI 30.09 kg/m     General appearance: alert, cooperative and appears stated age  Pelvic: External genitalia:  no lesions              Urethra:  normal appearing urethra with no masses, tenderness or lesions              Bartholins and Skenes: normal                 Vagina: orange colored ball in vagina, too large to get fingers around for removal.    Procedure:  ball grasped with tenaculum after applying Surgilube to introitus.  With retraction, the ball did come down the vagina but there was significant stretching of the introitus.  I was concerned about possible laceration.  Using a #11 blade 2 large wedges were cut out of the ball.  Ring forceps were used to grasp the edges of the ball and retract towards each other allowing for the diameter of the ball to decrease.  Slowly the ball was  removed from the vagina.  There was a small laceration about 5:00 with some bleeding that stopped with a little pressure.  No suturing was required.  Patient tolerated this well.  After removal of the ball, Pap smear was obtained as this is due.  Cervix appeared normal.  Bimanual exam showed normal shape to uterus that is mobile.  Small ovaries present bilaterally.  No pelvic masses noted.  Chaperone, Myrtie Atkinson, CMA, was present for exam.  Assessment/Plan: 1. Incomplete uterine prolapse (Primary) -Removal of foreign body was successful today.  Patient is not interested in using a pessary.  She would like to consider surgical correction.  I do think she be a good candidate for this.  Referral will be made. - Ambulatory referral to Urogynecology  2. Cervical cancer screening - Cytology - PAP( [place])  Total time with patient: 23 minutes Documentation: 5 minutes

## 2023-12-25 ENCOUNTER — Other Ambulatory Visit: Payer: Self-pay | Admitting: Radiology

## 2023-12-26 ENCOUNTER — Other Ambulatory Visit: Payer: Self-pay

## 2023-12-26 ENCOUNTER — Ambulatory Visit (HOSPITAL_BASED_OUTPATIENT_CLINIC_OR_DEPARTMENT_OTHER): Admitting: Obstetrics & Gynecology

## 2023-12-26 ENCOUNTER — Other Ambulatory Visit (INDEPENDENT_AMBULATORY_CARE_PROVIDER_SITE_OTHER): Payer: Self-pay | Admitting: Otolaryngology

## 2023-12-26 ENCOUNTER — Ambulatory Visit (HOSPITAL_COMMUNITY)
Admission: RE | Admit: 2023-12-26 | Discharge: 2023-12-26 | Disposition: A | Discharge status: Home / Self Care | Source: Ambulatory Visit | Attending: Otolaryngology | Admitting: Otolaryngology

## 2023-12-26 DIAGNOSIS — C8290 Follicular lymphoma, unspecified, unspecified site: Secondary | ICD-10-CM | POA: Insufficient documentation

## 2023-12-26 DIAGNOSIS — R221 Localized swelling, mass and lump, neck: Secondary | ICD-10-CM

## 2023-12-26 DIAGNOSIS — R590 Localized enlarged lymph nodes: Secondary | ICD-10-CM | POA: Insufficient documentation

## 2023-12-26 MED ORDER — MIDAZOLAM HCL 2 MG/2ML IJ SOLN
INTRAMUSCULAR | Status: AC | PRN
  Administered 2023-12-26 (×2): 1 mg via INTRAVENOUS

## 2023-12-26 MED ORDER — LIDOCAINE HCL (PF) 1 % IJ SOLN
5.0000 mL | Freq: Once | INTRAMUSCULAR | Status: AC
  Administered 2023-12-26: 5 mL via INTRADERMAL

## 2023-12-26 MED ORDER — FENTANYL CITRATE (PF) 100 MCG/2ML IJ SOLN
INTRAMUSCULAR | Status: AC | PRN
Start: 2023-12-26 — End: 2023-12-26
  Administered 2023-12-26 (×2): 50 ug via INTRAVENOUS

## 2023-12-26 MED ORDER — FENTANYL CITRATE (PF) 100 MCG/2ML IJ SOLN
INTRAMUSCULAR | Status: AC
  Filled 2023-12-26: qty 2

## 2023-12-26 MED ORDER — MIDAZOLAM HCL 2 MG/2ML IJ SOLN
INTRAMUSCULAR | Status: AC
  Filled 2023-12-26: qty 2

## 2023-12-26 NOTE — Procedures (Signed)
 Interventional Radiology Procedure Note  Procedure: Ultrasound guided right neck mass biopsy   Findings: Please refer to procedural dictation for full description.18 ga core x4, samples split between formalin and saline.  Complications: None immediate  Estimated Blood Loss: <5 mL  Recommendations: Follow Pathology results.   Creasie Doctor, MD

## 2023-12-26 NOTE — H&P (Addendum)
 Chief Complaint: Right neck mass; request for image guided right neck mass  Referring Provider(s): Patel,Kunjan B   Supervising Physician: Creasie Doctor  Patient Status: [facility] - Out-pt  History of Present Illness: Tracy Bradley is a 68 y.o. female with PMH which includes anemia. She was recently seen by ENT on 12/18/23 for R neck mass that started in 06/2023 which has since grown and is accompanied by additional smaller L neck masses per pt. The pressure from the size of the mass is uncomfortable but she denies pain or difficulty swallowing or breathing due to the mass. Transnasal Laryngoscopy performed in ENT office without obvious lesions noted. IR consulted for image guided core needle biopsy of the right neck mass with plans for ENT Dr. Lydia Sams to f/u in 2 weeks.  Her voice is clear; she is tolerating room air well. She feels anxious about the procedure but denies any complaint otherwise. See full ROS below.  She is prepared for possible moderate sedation; has been NPO, has ride/supervision.      Patient is Full Code  Past Medical History:  Diagnosis Date   Anemia    since childhood   Ileus following gastrointestinal surgery (HCC) 04/22/2012   Muscle strain 04/22/2012    Past Surgical History:  Procedure Laterality Date   BACK SURGERY     1980- low back ruptured disk   LAPAROSCOPIC APPENDECTOMY  04/14/2012   Procedure: APPENDECTOMY LAPAROSCOPIC;  Surgeon: Thayne Fine, MD;  Location: [facility];  Service: General;  Laterality: N/A;   TONSILLECTOMY     1980s    Allergies: Patient has no known allergies.  Medications: Prior to Admission medications   Not on File     Family History  Problem Relation Age of Onset   Stroke Mother    Lymphoma Father    Drug abuse Daughter    Depression Daughter    Bipolar disorder Daughter    Breast cancer Maternal Aunt 80 - 81   Breast cancer Paternal Aunt 36 - 78   Stroke Maternal Grandmother    Stroke Paternal Grandfather     Diabetes Brother    Hyperlipidemia Brother    Kidney disease Brother        dehydration and stopped dm meds etc- thankfully had recovery   Depression Brother    Drug abuse Brother    Drug abuse Son    Depression Son    BRCA 1/2 Neg Hx     Social History   Socioeconomic History   Marital status: Married    Spouse name: Not on file   Number of children: Not on file   Years of education: Not on file   Highest education level: Not on file  Occupational History   Not on file  Tobacco Use   Smoking status: Never   Smokeless tobacco: Never  Vaping Use   Vaping status: Never Used  Substance and Sexual Activity   Alcohol use: No   Drug use: No   Sexual activity: Yes    Birth control/protection: Post-menopausal    Comment: husband only  Other Topics Concern   Not on file  Social History Narrative   Married. 3 children- 40, 38, 25 in 2025- 2 sons and a daughter. No grandkids yet.       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 drug abuse (children had gone there)   Social Drivers of Health  Financial Resource Strain: Not on file  Food Insecurity: Not on file  Transportation Needs: Not on file  Physical Activity: Not on file  Stress: Not on file  Social Connections: Not on file     Review of Systems: A 12 point ROS discussed and pertinent positives are indicated in the HPI above.  All other systems are negative.  Review of Systems  Constitutional:  Negative for chills and fever.  HENT:  Negative for trouble swallowing and voice change.   Respiratory:  Negative for shortness of breath.   Cardiovascular:  Negative for chest pain.  Gastrointestinal:  Negative for abdominal pain, blood in stool, nausea and vomiting.  Genitourinary:  Negative for hematuria.  Musculoskeletal:  Positive for neck stiffness.  Hematological:  Does not bruise/bleed easily.    Vital Signs: BP 126/78   Pulse  87   Temp 98.2 F (36.8 C) (Oral)   Resp (!) 21   Ht 5' 8.5" (1.74 m)   Wt 196 lb (88.9 kg)   SpO2 95%   BMI 29.37 kg/m   Advance Care Plan: No documents on file    Physical Exam HENT:     Mouth/Throat:     Mouth: Mucous membranes are moist.     Pharynx: Oropharynx is clear.  Neck:     Comments: Firm R neck mass, non-tender, prominent enough to be seen with visual exam Cardiovascular:     Rate and Rhythm: Normal rate and regular rhythm.  Pulmonary:     Effort: Pulmonary effort is normal.  Abdominal:     Palpations: Abdomen is soft.     Tenderness: There is no abdominal tenderness.  Musculoskeletal:     Cervical back: No tenderness.     Right lower leg: No edema.     Left lower leg: No edema.  Skin:    General: Skin is warm and dry.  Neurological:     Mental Status: She is alert and oriented to person, place, and time.  Psychiatric:        Mood and Affect: Mood normal.        Behavior: Behavior normal.     Imaging: MM 3D SCREENING MAMMOGRAM BILATERAL BREAST Result Date: 12/21/2023 CLINICAL DATA:  Screening. EXAM: DIGITAL SCREENING BILATERAL MAMMOGRAM WITH TOMOSYNTHESIS AND CAD TECHNIQUE: Bilateral screening digital craniocaudal and mediolateral oblique mammograms were obtained. Bilateral screening digital breast tomosynthesis was performed. The images were evaluated with computer-aided detection. COMPARISON:  Previous exam(s). ACR Breast Density Category b: There are scattered areas of fibroglandular density. FINDINGS: There are no findings suspicious for malignancy. IMPRESSION: No mammographic evidence of malignancy. A result letter of this screening mammogram will be mailed directly to the patient. RECOMMENDATION: Screening mammogram in one year. (Code:SM-B-01Y) BI-RADS CATEGORY  1: Negative. Electronically Signed   By: Anna Barnes M.D.   On: 12/21/2023 14:25   CT Soft Tissue Neck W Contrast Result Date: 12/18/2023 CLINICAL DATA:  Right neck mass. EXAM: CT NECK WITH  CONTRAST TECHNIQUE: Multidetector CT imaging of the neck was performed using the standard protocol following the bolus administration of intravenous contrast. RADIATION DOSE REDUCTION: This exam was performed according to the departmental dose-optimization program which includes automated exposure control, adjustment of the mA and/or kV according to patient size and/or use of iterative reconstruction technique. CONTRAST:  75mL OMNIPAQUE  IOHEXOL  300 MG/ML  SOLN COMPARISON:  None Available. FINDINGS: Pharynx and larynx: Slight asymmetric soft tissue is present in the posterolateral oropharynx on image 47 of series 5 and image 67 of  series 100. No other definite mucosal or submucosal lesion is present. Nasopharynx is clear. The soft palate and tongue base are within normal limits. The oropharynx is otherwise unremarkable. Vallecula and epiglottis are within normal limits. Aryepiglottic folds and piriform sinuses are clear. Vocal cords are midline and symmetric. Trachea is clear. Salivary glands: The submandibular and parotid glands and ducts are within normal limits. Thyroid: Normal Lymph nodes: Asymmetric enlarged homogeneous right cervical lymph nodes are present. 3 markedly enlarged right submandibular nodes are present. Numerous posterior measures 3.1 x 2.7 x 2.2 cm. A more anterior and inferior node The more anterior and inferior noted measures 2.6 x 1.8 x 2.1 cm. The most medial node measures 3.0 x 1.8 x 2.0 cm. An anterior right level 3/4 node measures 2.4 x 2.7 x 5.0 cm. Left-sided nodes are less prominent. The largest nodes are 2 cm nodes at the left level 2 and left supraclavicular stations. Vascular: Minimal atherosclerotic calcifications are present at the carotid bifurcation and great vessel origins without significant stenosis. Limited intracranial: Within normal limits. Visualized orbits: The globes and orbits are within normal limits. Mastoids and visualized paranasal sinuses: The paranasal sinuses and  mastoid air cells are clear. Skeleton: Grade 1 degenerative anterolisthesis at C3-4 measures 4 mm. Chronic endplate degenerative changes are greatest at C4-5 with uncovertebral spurring contributing to foraminal narrowing bilaterally, left greater than right. Bilateral uncovertebral and foraminal narrowing is present at C5-6. Upper chest: The lung apices are clear. The thoracic inlet is within normal limits. IMPRESSION: 1. Asymmetric enlarged homogeneous right cervical lymph nodes as described. Findings are concerning for metastatic disease. 2. Slight asymmetric soft tissue in the posterolateral oropharynx is nonspecific. This may represent normal asymmetric mucosal tissue. Recommend direct visualization. 3. Left-sided nodes are less prominent. The largest nodes are 2 cm nodes at the left level 2 and left supraclavicular stations. 4. Degenerative changes of the cervical spine as described. Electronically Signed   By: Audree Leas M.D.   On: 12/18/2023 19:24    Labs:  CBC: Recent Labs    12/06/23 1106  WBC 5.6  HGB 13.3  HCT 40.0  PLT 255.0    COAGS: No results for input(s): "INR", "APTT" in the last 8760 hours.  BMP: Recent Labs    12/06/23 1106  NA 139  K 4.0  CL 103  CO2 25  GLUCOSE 98  BUN 17  CALCIUM 9.6  CREATININE 0.73    LIVER FUNCTION TESTS: Recent Labs    12/06/23 1106  BILITOT 0.4  AST 31  ALT 28  ALKPHOS 73  PROT 7.2  ALBUMIN 4.8    TUMOR MARKERS: No results for input(s): "AFPTM", "CEA", "CA199", "CHROMGRNA" in the last 8760 hours.  Assessment and Plan:  Request for  image guided right neck biopsy -No contraindications for procedure identified in ROS, physical exam, or review of pre-sedation considerations. -12/06/23 Labs reviewed and within acceptable range with no indication to repeat today. -12/18/23 imaging available and reviewed, notes the asymmetric enlarged homogeneous right cervical lymph nodes   -VSS, afebrile  Risks and benefits of  image guided right neck mass biopsy was discussed with the patient and/or patient's family including, but not limited to bleeding, infection, damage to adjacent structures or low yield requiring additional tests.   All of the questions were answered and there is agreement to proceed.  Consent signed and in chart.     Thank you for allowing our service to participate in Yuma Spayde 's care.  Electronically Signed: Terressa Fess, NP   12/26/2023, 12:41 PM     I spent a total of  30 Minutes  in face to face in clinical consultation, greater than 50% of which was counseling/coordinating care for image guided right neck mass biopsy   (A copy of this note was sent to the referring provider and the time of visit.)

## 2023-12-31 LAB — SURGICAL PATHOLOGY

## 2024-01-02 ENCOUNTER — Encounter (INDEPENDENT_AMBULATORY_CARE_PROVIDER_SITE_OTHER): Payer: Self-pay

## 2024-01-02 ENCOUNTER — Ambulatory Visit (INDEPENDENT_AMBULATORY_CARE_PROVIDER_SITE_OTHER): Admitting: Otolaryngology

## 2024-01-02 VITALS — BP 137/84 | HR 68 | Ht 68.5 in | Wt 196.0 lb

## 2024-01-02 DIAGNOSIS — C8231 Follicular lymphoma grade IIIa, lymph nodes of head, face, and neck: Secondary | ICD-10-CM

## 2024-01-02 NOTE — Progress Notes (Signed)
 Dear Dr. Arlene Ben, Here is my assessment for our mutual patient, Tracy Bradley. Thank you for allowing me the opportunity to care for your patient. Please do not hesitate to contact me should you have any other questions. Sincerely, Dr. Milon Aloe  Otolaryngology Clinic Note Referring provider: Dr. Arlene Ben HPI:  Tracy Bradley is a 68 y.o. female kindly referred by Dr. Arlene Ben for evaluation of right neck mass  Initial visit (11/2023): Patient reports: right neck mass popped up suddenly in Nov 2024, no antecedent event including URI. She reports it is growing and has noticed other neck masses come up over past couple of months. Not painful, but now slightly uncomfortable/pressure.  Patient otherwise denies: - dysphagia, odynophagia, aspiration episodes or PNA, need for Heimlich, unintentional weight loss, fevers/chills/night sweats - changes in voice, shortness of breath, hemoptysis, tobacco or significant alcohol history, ear pain  No history of facial skin cancers  --------------------------------------------------------- 01/02/2024 Presents for follow up. She reports that she is otherwise doing ok. We discussed her cancer diagnosis.  H&N Surgery: tonsillectomy (as adult) Personal or FHx of bleeding dz or anesthesia difficulty: no  GLP-1: no AP/AC: no  Tobacco: no. Alcohol: no. Occupation: Agricultural consultant at [employer], prior Administrator.  PMHx: Anemia  Independent Review of Additional Tests or Records:  Dr. Arlene Ben (12/06/2023): Right neck mass noted in Nov 2024, grown in size, popped up suddenly; Dx: Right neck mass, Rx: Ref to ENT CBC, CRP, Sed, and CMP reviewed 12/06/2023: BUN/Cr 17/0.73; WBC 5.6, Plt 255, CRP/Sed rate normal Path (12/26/2023): independently interpreted: high grade folliucular lymphoma   PMH/Meds/All/SocHx/FamHx/ROS:   Past Medical History:  Diagnosis Date   Anemia    since childhood   Ileus following gastrointestinal surgery (HCC) 04/22/2012   Muscle strain  04/22/2012     Past Surgical History:  Procedure Laterality Date   BACK SURGERY     1980- low back ruptured disk   LAPAROSCOPIC APPENDECTOMY  04/14/2012   Procedure: APPENDECTOMY LAPAROSCOPIC;  Surgeon: Thayne Fine, MD;  Location: [facility];  Service: General;  Laterality: N/A;   TONSILLECTOMY     1980s    Family History  Problem Relation Age of Onset   Stroke Mother    Lymphoma Father    Drug abuse Daughter    Depression Daughter    Bipolar disorder Daughter    Breast cancer Maternal Aunt 26 - 49   Breast cancer Paternal Aunt 61 - 44   Stroke Maternal Grandmother    Stroke Paternal Grandfather    Diabetes Brother    Hyperlipidemia Brother    Kidney disease Brother        dehydration and stopped dm meds etc- thankfully had recovery   Depression Brother    Drug abuse Brother    Drug abuse Son    Depression Son    BRCA 1/2 Neg Hx      Social Connections: Not on file     No current outpatient medications on file.   Physical Exam:   BP 137/84 (BP Location: Right Arm, Patient Position: Sitting, Cuff Size: Large)   Pulse 68   Ht 5' 8.5" (1.74 m)   Wt 196 lb (88.9 kg)   SpO2 95%   BMI 29.37 kg/m   Salient findings:  CN II-XII intact No lesions of oral cavity/oropharynx; dentition fair; tonsils absent, no masses noted over palpable tongue base No obviously palpable thyromegaly; right neck submandibular lymph node, and also with right level 2/3/4 nodes (all seem firm but mobile) No respiratory  distress or stridor  Seprately Identifiable Procedures:  Prior, not today: TFL: Procedure:  Patient was identified as correct patient. Verbal consent was obtained. The nose was sprayed with oxymetazoline and 4% lidocaine . The The flexible laryngoscope was passed through the nose to view the nasal cavity, pharynx (oropharynx, hypopharynx) and larynx.  The larynx was examined at rest and during multiple phonatory tasks. Documentation was obtained and reviewed with patient. The  scope was removed. The patient tolerated the procedure well.  Findings: The nasal cavity and nasopharynx did not reveal any masses or lesions, mucosa appeared to be without obvious lesions. The tongue base, pharyngeal walls, piriform sinuses, vallecula, epiglottis and postcricoid region are normal in appearance without obvious masses. The visualized portion of the subglottis and proximal trachea is widely patent. The vocal folds are mobile bilaterally. There are no lesions on the free edge of the vocal folds nor elsewhere in the larynx worrisome for malignancy.    Electronically signed by: Evelina Hippo, MD 01/02/2024 10:11 AM   Impression & Plans:  Tracy Bradley is a 68 y.o. female with:  1. 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     Thank you for allowing me the opportunity to care for your patient. Please do not hesitate to contact me should you have any other questions.  Sincerely, Milon Aloe, MD Otolaryngologist (ENT), [facility] Phone: [phone] Fax: [phone]  01/02/2024, 10:11 AM   I have personally spent 31 minutes involved in face-to-face and non-face-to-face activities for this patient on the day of the visit.  Professional time spent excludes any procedures performed but includes the following activities, in addition to those noted in the documentation: preparing to see the patient (review of outside documentation and results), performing a medically appropriate examination, counseling, documenting in the electronic health record, interpreting results (path).

## 2024-01-03 ENCOUNTER — Ambulatory Visit (HOSPITAL_BASED_OUTPATIENT_CLINIC_OR_DEPARTMENT_OTHER): Admitting: Obstetrics & Gynecology

## 2024-01-03 ENCOUNTER — Encounter (HOSPITAL_BASED_OUTPATIENT_CLINIC_OR_DEPARTMENT_OTHER): Payer: Self-pay | Admitting: Obstetrics & Gynecology

## 2024-01-03 VITALS — BP 124/77 | HR 67 | Ht 68.5 in | Wt 196.0 lb

## 2024-01-03 DIAGNOSIS — N812 Incomplete uterovaginal prolapse: Secondary | ICD-10-CM

## 2024-01-03 DIAGNOSIS — Z4689 Encounter for fitting and adjustment of other specified devices: Secondary | ICD-10-CM

## 2024-01-04 ENCOUNTER — Ambulatory Visit (HOSPITAL_BASED_OUTPATIENT_CLINIC_OR_DEPARTMENT_OTHER): Admit: 2024-01-04

## 2024-01-04 ENCOUNTER — Encounter (HOSPITAL_BASED_OUTPATIENT_CLINIC_OR_DEPARTMENT_OTHER): Payer: Self-pay

## 2024-01-04 SURGERY — MYRINGOTOMY WITH TUBE PLACEMENT
Anesthesia: General | Laterality: Bilateral

## 2024-01-06 NOTE — Progress Notes (Signed)
 68 y.o. Married White female [phone] here for discussion of other treatment options for incomplete uterine prolapse.  She has been referred to urogynecology but reports appointment was not offered until August so she would like to consider other options.  She has done some reading and is interested in trying a pessary.   Different sizes and styles discussed and need for adjustment from time to time.  Currently has no complaints other than the prolapse and discomfort with it.  No Known Allergies  ROS: Denies:  urinary incontinence or vaginal bleeding  Exam:   BP 124/77 (BP Location: Left Arm, Patient Position: Sitting, Cuff Size: Normal)   Pulse 67   Ht 5' 8.5" (1.74 m)   Wt 196 lb (88.9 kg)   BMI 29.37 kg/m   General appearance: alert and no distress Inguinal lymph nodes:  not enlarged  Pelvic: External genitalia:  no lesions              Urethra: normal appearing urethra with no masses, tenderness or lesions              Bartholins and Skenes: Bartholin's, Urethra, Skene's normal                 Vagina: normal appearing vagina with normal color and discharge, no lesions              Cervix: no lesion  Fitting:  #4 incontinence ring placed without difficulty but there was room around the pessary.  Pessary removed and #5 incontinence ring placed.  There was good fit.  Pt was not able to expel with valsalva.  Pt able to remove and replace.    Assessment/Plan: 1. Incomplete uterine prolapse (Primary)  2. Encounter for fitting and adjustment of pessary - #5 incontinence ring place with with fit.  Pt educated about removal, cleaning and replacement.   - follow up discussed.  Pt is going to let me know if feels needs follow up.  For now, will not schedule anything.

## 2024-01-09 ENCOUNTER — Ambulatory Visit (HOSPITAL_COMMUNITY)

## 2024-01-16 ENCOUNTER — Inpatient Hospital Stay: Attending: Hematology and Oncology | Admitting: Hematology and Oncology

## 2024-01-16 ENCOUNTER — Inpatient Hospital Stay

## 2024-01-16 VITALS — BP 136/82 | HR 76 | Temp 97.7°F | Resp 14 | Wt 196.4 lb

## 2024-01-16 DIAGNOSIS — D649 Anemia, unspecified: Secondary | ICD-10-CM | POA: Diagnosis not present

## 2024-01-16 DIAGNOSIS — C823A Follicular lymphoma grade IIIA, in remission: Secondary | ICD-10-CM | POA: Diagnosis present

## 2024-01-16 DIAGNOSIS — Z803 Family history of malignant neoplasm of breast: Secondary | ICD-10-CM | POA: Diagnosis not present

## 2024-01-16 DIAGNOSIS — C8231 Follicular lymphoma grade IIIa, lymph nodes of head, face, and neck: Secondary | ICD-10-CM

## 2024-01-16 LAB — CMP (CANCER CENTER ONLY)
ALT: 24 U/L (ref 0–44)
AST: 28 U/L (ref 15–41)
Albumin: 4.5 g/dL (ref 3.5–5.0)
Alkaline Phosphatase: 60 U/L (ref 38–126)
Anion gap: 6 (ref 5–15)
BUN: 13 mg/dL (ref 8–23)
CO2: 30 mmol/L (ref 22–32)
Calcium: 9.3 mg/dL (ref 8.9–10.3)
Chloride: 106 mmol/L (ref 98–111)
Creatinine: 0.83 mg/dL (ref 0.44–1.00)
GFR, Estimated: >60 mL/min (ref >60)
Glucose, Bld: 113 mg/dL — ABNORMAL HIGH (ref 70–99)
Potassium: 4.3 mmol/L (ref 3.5–5.1)
Sodium: 142 mmol/L (ref 135–145)
Total Bilirubin: 0.3 mg/dL (ref 0.0–1.2)
Total Protein: 6.8 g/dL (ref 6.5–8.1)

## 2024-01-16 LAB — CBC WITH DIFFERENTIAL (CANCER CENTER ONLY)
Abs Immature Granulocytes: 0.01 10*3/uL (ref 0.00–0.07)
Basophils Absolute: 0.1 10*3/uL (ref 0.0–0.1)
Basophils Relative: 1 %
Eosinophils Absolute: 0.3 10*3/uL (ref 0.0–0.5)
Eosinophils Relative: 4 %
HCT: 36.9 % (ref 36.0–46.0)
Hemoglobin: 12.6 g/dL (ref 12.0–15.0)
Immature Granulocytes: 0 %
Lymphocytes Relative: 16 %
Lymphs Abs: 0.9 10*3/uL (ref 0.7–4.0)
MCH: 30.9 pg (ref 26.0–34.0)
MCHC: 34.1 g/dL (ref 30.0–36.0)
MCV: 90.4 fL (ref 80.0–100.0)
Monocytes Absolute: 0.5 10*3/uL (ref 0.1–1.0)
Monocytes Relative: 8 %
Neutro Abs: 4.2 10*3/uL (ref 1.7–7.7)
Neutrophils Relative %: 71 %
Platelet Count: 223 10*3/uL (ref 150–400)
RBC: 4.08 MIL/uL (ref 3.87–5.11)
RDW: 13.2 % (ref 11.5–15.5)
WBC Count: 5.9 10*3/uL (ref 4.0–10.5)
nRBC: 0 % (ref 0.0–0.2)

## 2024-01-16 LAB — SEDIMENTATION RATE: Sed Rate: 4 mm/h (ref 0–22)

## 2024-01-16 LAB — LACTATE DEHYDROGENASE: LDH: 170 U/L (ref 98–192)

## 2024-01-16 NOTE — Progress Notes (Signed)
 [facility] Telephone:(336) [phone]   Fax:([phone]  INITIAL CONSULT NOTE  Patient Care Team: Almira Jaeger, MD as PCP - General (Family Medicine) Sumner Ends, MD as Consulting Physician (Hematology and Oncology)  Hematological/Oncological History # Follicular Lymphoma Grade 3A 12/26/2023: right cervical lymph node biopsy showed follicular lymphoma with focal centroblasts approaching 15/HPF suggestive of grade 3A, i.e. high-grade, follicular lymphoma   CHIEF COMPLAINTS/PURPOSE OF CONSULTATION:  "Follicular Lymphoma Grade 3A "  HISTORY OF PRESENTING ILLNESS:  Tracy Bradley 68 y.o. female with no significant past medical history who presents for evaluation of a newly diagnosed grade 3A follicular lymphoma.  On review of the previous records she underwent a CT scan on 12/18/2023 of her neck.  This showed asymmetric enlarged right cervical lymph nodes as well as less prominent left-sided lymph nodes at about 2 cm.  Subsequently a biopsy on 12/26/2023 of the right cervical lymph node which showed follicular lymphoma with focal centroblasts approaching 15 per high-power field, suggestive of a grade 3A, high-grade follicular lymphoma.  Due to concern for these findings the patient was referred to hematology for further evaluation and management.  On exam today Ms. Schiro reports that she noticed a bump on the side of her neck which was growing larger and larger.  She reports it is nontender and is not uncomfortable.  It is not causing any difficulty with swallowing or with breathing.  She notes that she did see one of our hematologist in 2011 for lymphadenopathy in the abdomen.  No further workup was pursued at that time as it appeared benign.  On further discussion she reports that her father had non-Hodgkin's lymphoma and underwent a stem cell transplant in the year 2000.  She reports that she had a maternal aunt paternal aunt with breast cancer.  She reports her mother  passed away from a stroke.  She has 3 healthy children.  She is a never smoker never drinker and previously worked as a Manufacturing systems engineer.  Overall she feels well and has no other questions concerns or complaints today.  A full 10 point ROS is otherwise negative.  MEDICAL HISTORY:  Past Medical History:  Diagnosis Date   Anemia    since childhood   Ileus following gastrointestinal surgery (HCC) 04/22/2012   Muscle strain 04/22/2012    SURGICAL HISTORY: Past Surgical History:  Procedure Laterality Date   BACK SURGERY     1980- low back ruptured disk   LAPAROSCOPIC APPENDECTOMY  04/14/2012   Procedure: APPENDECTOMY LAPAROSCOPIC;  Surgeon: Thayne Fine, MD;  Location: [facility];  Service: General;  Laterality: N/A;   TONSILLECTOMY     1980s    SOCIAL HISTORY: Social History   Socioeconomic History   Marital status: Married    Spouse name: Not on file   Number of children: Not on file   Years of education: Not on file   Highest education level: Not on file  Occupational History   Not on file  Tobacco Use   Smoking status: Never   Smokeless tobacco: Never  Vaping Use   Vaping status: Never Used  Substance and Sexual Activity   Alcohol use: No   Drug use: No   Sexual activity: Yes    Birth control/protection: Post-menopausal    Comment: husband only  Other Topics Concern   Not on file  Social History Narrative   Married. 3 children- 40, 38, 25 in 2025- 2 sons and a daughter. No grandkids yet.  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 drug abuse (children had gone there)   Social Drivers of Corporate investment banker Strain: Not on file  Food Insecurity: No Food Insecurity (01/16/2024)   Hunger Vital Sign    Worried About Running Out of Food in the Last Year: Never true    Ran Out of Food in the Last Year: Never true  Transportation Needs: No Transportation Needs (01/16/2024)    PRAPARE - Administrator, Civil Service (Medical): No    Lack of Transportation (Non-Medical): No  Physical Activity: Not on file  Stress: Not on file  Social Connections: Not on file  Intimate Partner Violence: Not At Risk (01/16/2024)   Humiliation, Afraid, Rape, and Kick questionnaire    Fear of Current or Ex-Partner: No    Emotionally Abused: No    Physically Abused: No    Sexually Abused: No    FAMILY HISTORY: Family History  Problem Relation Age of Onset   Stroke Mother    Lymphoma Father    Drug abuse Daughter    Depression Daughter    Bipolar disorder Daughter    Breast cancer Maternal Aunt 37 - 78   Breast cancer Paternal Aunt 17 - 31   Stroke Maternal Grandmother    Stroke Paternal Grandfather    Diabetes Brother    Hyperlipidemia Brother    Kidney disease Brother        dehydration and stopped dm meds etc- thankfully had recovery   Depression Brother    Drug abuse Brother    Drug abuse Son    Depression Son    BRCA 1/2 Neg Hx     ALLERGIES:  has no known allergies.  MEDICATIONS:  No current outpatient medications on file.   No current facility-administered medications for this visit.    REVIEW OF SYSTEMS:   Constitutional: ( - ) fevers, ( - )  chills , ( - ) night sweats Eyes: ( - ) blurriness of vision, ( - ) double vision, ( - ) watery eyes Ears, nose, mouth, throat, and face: ( - ) mucositis, ( - ) sore throat Respiratory: ( - ) cough, ( - ) dyspnea, ( - ) wheezes Cardiovascular: ( - ) palpitation, ( - ) chest discomfort, ( - ) lower extremity swelling Gastrointestinal:  ( - ) nausea, ( - ) heartburn, ( - ) change in bowel habits Skin: ( - ) abnormal skin rashes Lymphatics: ( - ) new lymphadenopathy, ( - ) easy bruising Neurological: ( - ) numbness, ( - ) tingling, ( - ) new weaknesses Behavioral/Psych: ( - ) mood change, ( - ) new changes  All other systems were reviewed with the patient and are negative.  PHYSICAL  EXAMINATION:  Vitals:   01/16/24 1305  BP: 136/82  Pulse: 76  Resp: 14  Temp: 97.7 F (36.5 C)  SpO2: 99%   Filed Weights   01/16/24 1305  Weight: 196 lb 6.4 oz (89.1 kg)    GENERAL: well appearing middle-age Caucasian female in NAD  SKIN: skin color, texture, turgor are normal, no rashes or significant lesions EYES: conjunctiva are pink and non-injected, sclera clear LUNGS: clear to auscultation and percussion with normal breathing effort HEART: regular rate & rhythm and no murmurs and no lower extremity edema Musculoskeletal: no cyanosis of digits and no clubbing  PSYCH: alert & oriented x 3, fluent speech NEURO:  no focal motor/sensory deficits  LABORATORY DATA:  I have reviewed the data as listed    Latest Ref Rng & Units 01/16/2024    2:29 PM 12/06/2023   11:06 AM 04/21/2012    4:19 AM  CBC  WBC 4.0 - 10.5 K/uL 5.9  5.6  9.4   Hemoglobin 12.0 - 15.0 g/dL 04.5  40.9  81.1   Hematocrit 36.0 - 46.0 % 36.9  40.0  30.3   Platelets 150 - 400 K/uL 223  255.0  420        Latest Ref Rng & Units 01/16/2024    2:29 PM 12/06/2023   11:06 AM 04/18/2012    3:40 AM  CMP  Glucose 70 - 99 mg/dL 914  98  782   BUN 8 - 23 mg/dL 13  17  4    Creatinine 0.44 - 1.00 mg/dL 9.56  2.13  0.86   Sodium 135 - 145 mmol/L 142  139  138   Potassium 3.5 - 5.1 mmol/L 4.3  4.0  3.6   Chloride 98 - 111 mmol/L 106  103  104   CO2 22 - 32 mmol/L 30  25  27    Calcium 8.9 - 10.3 mg/dL 9.3  9.6  8.2   Total Protein 6.5 - 8.1 g/dL 6.8  7.2    Total Bilirubin 0.0 - 1.2 mg/dL 0.3  0.4    Alkaline Phos 38 - 126 U/L 60  73    AST 15 - 41 U/L 28  31    ALT 0 - 44 U/L 24  28       ASSESSMENT & PLAN Joesph Mussel 68 y.o. female with no significant past medical history who presents for evaluation of a newly diagnosed grade 3A follicular lymphoma.  After review of the labs, review of the records, and discussion with the patient the patients findings are most consistent with high-grade follicular  lymphoma, staging in process.  Today we discussed the nature of follicular lymphoma and steps moving forward.  We discussed that it tends to be a more indolent lymphoma and the treatment may not be required immediately.  We discussed criteria for treatment including bulky disease as well as rapid progression.  Additionally we discussed that her blood counts appear stable at last check, we will recheck these today.  We also noted that staging could be completed with a PET CT scan.  Pending results that scan we will will determine next best steps moving forward.  The patient voiced understanding of our findings, the nature of the disease, and the plan moving forward.   # Follicular Lymphoma grade 3a, Staging in Process -- Needle core biopsy confirmed follicular lymphoma, grade 3A (high-grade). -- At this time recommend a PET CT scan in order to complete staging for this patient. -- Will order ESR and CRP as well as baseline CBC and CMP today. -- Will also order flow cytometry. -- Pending results of PET CT scan will determine next steps moving forward. -- May need to consider excisional biopsy, though core biopsy did appear to provide adequate tissue for grading and diagnosis. -- Plan for return to clinic pending the results of the above studies.  Orders Placed This Encounter  Procedures   NM PET Image Initial (PI) Skull Base To Thigh (F-18 FDG)    Standing Status:   Future    Number of Occurrences:   1    Expected Date:   01/23/2024    Expiration Date:  01/15/2025    If indicated for the ordered procedure, I authorize the administration of a radiopharmaceutical per Radiology protocol:   Yes    Preferred imaging location?:   [facility]   CBC with Differential (Cancer Center Only)    Standing Status:   Future    Number of Occurrences:   1    Expiration Date:   01/15/2025   CMP (Cancer Center only)    Standing Status:   Future    Number of Occurrences:   1    Expiration Date:   01/15/2025    Sedimentation rate    Standing Status:   Future    Number of Occurrences:   1    Expiration Date:   01/15/2025   Lactate dehydrogenase (LDH)    Standing Status:   Future    Number of Occurrences:   1    Expiration Date:   01/15/2025   Flow Cytometry, Peripheral Blood (Oncology)    Standing Status:   Future    Number of Occurrences:   1    Expected Date:   01/16/2024    Expiration Date:   01/15/2025    All questions were answered. The patient knows to call the clinic with any problems, questions or concerns.  A total of more than 60 minutes were spent on this encounter with face-to-face time and non-face-to-face time, including preparing to see the patient, ordering tests and/or medications, counseling the patient and coordination of care as outlined above.   Rogerio Clay, MD Department of Hematology/Oncology [facility] at [facility] Phone: [phone] Pager: [phone] Email: [email or web address]  01/26/2024 4:13 PM

## 2024-01-17 ENCOUNTER — Ambulatory Visit: Payer: PRIVATE HEALTH INSURANCE | Admitting: Family Medicine

## 2024-01-18 LAB — SURGICAL PATHOLOGY

## 2024-01-18 LAB — FLOW CYTOMETRY

## 2024-01-25 ENCOUNTER — Encounter (HOSPITAL_COMMUNITY)
Admission: RE | Admit: 2024-01-25 | Discharge: 2024-01-25 | Disposition: A | Discharge status: Home / Self Care | Source: Ambulatory Visit | Attending: Hematology and Oncology | Admitting: Hematology and Oncology

## 2024-01-25 DIAGNOSIS — C8231 Follicular lymphoma grade IIIa, lymph nodes of head, face, and neck: Secondary | ICD-10-CM | POA: Insufficient documentation

## 2024-01-25 LAB — GLUCOSE, CAPILLARY: Glucose-Capillary: 111 mg/dL — ABNORMAL HIGH (ref 70–99)

## 2024-01-25 MED ORDER — FLUDEOXYGLUCOSE F - 18 (FDG) INJECTION
9.6000 | Freq: Once | INTRAVENOUS | Status: AC | PRN
  Administered 2024-01-25: 9.8 via INTRAVENOUS

## 2024-01-29 ENCOUNTER — Other Ambulatory Visit: Payer: Self-pay | Admitting: Hematology and Oncology

## 2024-01-29 DIAGNOSIS — C8230 Follicular lymphoma grade IIIa, unspecified site: Secondary | ICD-10-CM | POA: Insufficient documentation

## 2024-01-29 DIAGNOSIS — C8238 Follicular lymphoma grade IIIa, lymph nodes of multiple sites: Secondary | ICD-10-CM

## 2024-01-31 ENCOUNTER — Telehealth: Payer: Self-pay | Admitting: Hematology and Oncology

## 2024-02-01 ENCOUNTER — Inpatient Hospital Stay (HOSPITAL_BASED_OUTPATIENT_CLINIC_OR_DEPARTMENT_OTHER): Admitting: Hematology and Oncology

## 2024-02-01 ENCOUNTER — Inpatient Hospital Stay: Attending: Hematology and Oncology

## 2024-02-01 ENCOUNTER — Telehealth (INDEPENDENT_AMBULATORY_CARE_PROVIDER_SITE_OTHER): Payer: Self-pay | Admitting: Otolaryngology

## 2024-02-01 ENCOUNTER — Other Ambulatory Visit: Payer: Self-pay | Admitting: Hematology and Oncology

## 2024-02-01 VITALS — BP 143/83 | HR 75 | Temp 98.7°F | Resp 15 | Wt 197.8 lb

## 2024-02-01 DIAGNOSIS — Z806 Family history of leukemia: Secondary | ICD-10-CM | POA: Diagnosis not present

## 2024-02-01 DIAGNOSIS — C8238 Follicular lymphoma grade IIIa, lymph nodes of multiple sites: Secondary | ICD-10-CM | POA: Diagnosis not present

## 2024-02-01 DIAGNOSIS — C823A Follicular lymphoma grade IIIA, in remission: Secondary | ICD-10-CM | POA: Insufficient documentation

## 2024-02-01 DIAGNOSIS — Z5111 Encounter for antineoplastic chemotherapy: Secondary | ICD-10-CM | POA: Insufficient documentation

## 2024-02-01 DIAGNOSIS — K5730 Diverticulosis of large intestine without perforation or abscess without bleeding: Secondary | ICD-10-CM | POA: Insufficient documentation

## 2024-02-01 DIAGNOSIS — Z803 Family history of malignant neoplasm of breast: Secondary | ICD-10-CM | POA: Insufficient documentation

## 2024-02-01 DIAGNOSIS — I700 Atherosclerosis of aorta: Secondary | ICD-10-CM | POA: Diagnosis not present

## 2024-02-01 DIAGNOSIS — Z79899 Other long term (current) drug therapy: Secondary | ICD-10-CM | POA: Insufficient documentation

## 2024-02-01 DIAGNOSIS — I517 Cardiomegaly: Secondary | ICD-10-CM | POA: Diagnosis not present

## 2024-02-01 DIAGNOSIS — D649 Anemia, unspecified: Secondary | ICD-10-CM | POA: Diagnosis not present

## 2024-02-01 DIAGNOSIS — R221 Localized swelling, mass and lump, neck: Secondary | ICD-10-CM

## 2024-02-01 DIAGNOSIS — C8231 Follicular lymphoma grade IIIa, lymph nodes of head, face, and neck: Secondary | ICD-10-CM

## 2024-02-01 LAB — CMP (CANCER CENTER ONLY)
ALT: 46 U/L — ABNORMAL HIGH (ref 0–44)
AST: 50 U/L — ABNORMAL HIGH (ref 15–41)
Albumin: 4.2 g/dL (ref 3.5–5.0)
Alkaline Phosphatase: 84 U/L (ref 38–126)
Anion gap: 4 — ABNORMAL LOW (ref 5–15)
BUN: 16 mg/dL (ref 8–23)
CO2: 29 mmol/L (ref 22–32)
Calcium: 8.8 mg/dL — ABNORMAL LOW (ref 8.9–10.3)
Chloride: 108 mmol/L (ref 98–111)
Creatinine: 0.71 mg/dL (ref 0.44–1.00)
GFR, Estimated: >60 mL/min (ref >60)
Glucose, Bld: 109 mg/dL — ABNORMAL HIGH (ref 70–99)
Potassium: 4.4 mmol/L (ref 3.5–5.1)
Sodium: 141 mmol/L (ref 135–145)
Total Bilirubin: 0.3 mg/dL (ref 0.0–1.2)
Total Protein: 6.6 g/dL (ref 6.5–8.1)

## 2024-02-01 LAB — CBC WITH DIFFERENTIAL (CANCER CENTER ONLY)
Abs Immature Granulocytes: 0.01 10*3/uL (ref 0.00–0.07)
Basophils Absolute: 0.1 10*3/uL (ref 0.0–0.1)
Basophils Relative: 1 %
Eosinophils Absolute: 0.3 10*3/uL (ref 0.0–0.5)
Eosinophils Relative: 5 %
HCT: 37.7 % (ref 36.0–46.0)
Hemoglobin: 12.8 g/dL (ref 12.0–15.0)
Immature Granulocytes: 0 %
Lymphocytes Relative: 13 %
Lymphs Abs: 0.8 10*3/uL (ref 0.7–4.0)
MCH: 31.1 pg (ref 26.0–34.0)
MCHC: 34 g/dL (ref 30.0–36.0)
MCV: 91.7 fL (ref 80.0–100.0)
Monocytes Absolute: 0.4 10*3/uL (ref 0.1–1.0)
Monocytes Relative: 6 %
Neutro Abs: 4.7 10*3/uL (ref 1.7–7.7)
Neutrophils Relative %: 75 %
Platelet Count: 206 10*3/uL (ref 150–400)
RBC: 4.11 MIL/uL (ref 3.87–5.11)
RDW: 13.2 % (ref 11.5–15.5)
WBC Count: 6.3 10*3/uL (ref 4.0–10.5)
nRBC: 0 % (ref 0.0–0.2)

## 2024-02-01 LAB — HEPATITIS B SURFACE ANTIBODY,QUALITATIVE: Hep B S Ab: NONREACTIVE

## 2024-02-01 LAB — HIV ANTIBODY (ROUTINE TESTING W REFLEX): HIV Screen 4th Generation wRfx: NONREACTIVE

## 2024-02-01 LAB — HEPATITIS B SURFACE ANTIGEN: Hepatitis B Surface Ag: NONREACTIVE

## 2024-02-01 LAB — HEPATITIS C ANTIBODY: HCV Ab: NONREACTIVE

## 2024-02-01 LAB — LACTATE DEHYDROGENASE: LDH: 177 U/L (ref 98–192)

## 2024-02-01 NOTE — Progress Notes (Signed)
 [facility] [phone]   Fax:([phone]  PROGRESS NOTE  Patient Care Team: Almira Jaeger, MD as PCP - General (Family Medicine) Sumner Ends, MD as Consulting Physician (Hematology and Oncology)  Hematological/Oncological History # Follicular Lymphoma Stage III, Grade 3a  12/26/2023: right cervical lymph node biopsy showed follicular lymphoma with focal centroblasts approaching 15/HPF suggestive of grade 3A, i.e. high-grade, follicular lymphoma  01/16/2024: establish care with Dr. Rosaline Coma  01/25/2024: NM PET CT scan showed evidence of active lymphoma within the neck, chest, abdomen, and pelvis. ([place]) 5, as well as mild splenic hypermetabolism   Interval History:  Tracy Bradley 68 y.o. female with medical history significant for follicular lymphoma, grade 3A, stage III who presents for a follow up visit. The patient's last visit was on 01/16/2024. In the interim since the last visit she has undergone a PET CT scan.  On exam today [name] is accompanied by her husband.  She reports that she has been well overall in the interim since her last visit.  She has had no changes in her health.  She denies any fevers, chills, sweats, nausea, Oni or diarrhea.  She denies any new palpable lymph nodes.  She continues to notice small increases in the lymph node in her neck.  A full 10 point ROS is otherwise negative.  The bulk of our discussion focused on the results of the scan and the steps moving forward.  We discussed excisional biopsy to be certain if this lymph node truly represents a follicular lymphoma, or if there is a high-grade component in the center.  She voiced understanding of our findings and plan moving forward.  She was agreeable to excisional biopsy.  MEDICAL HISTORY:  Past Medical History:  Diagnosis Date   Anemia    since childhood   Ileus following gastrointestinal surgery (HCC) 04/22/2012   Muscle strain 04/22/2012    SURGICAL  HISTORY: Past Surgical History:  Procedure Laterality Date   BACK SURGERY     1980- low back ruptured disk   LAPAROSCOPIC APPENDECTOMY  04/14/2012   Procedure: APPENDECTOMY LAPAROSCOPIC;  Surgeon: Thayne Fine, MD;  Location: [facility];  Service: General;  Laterality: N/A;   TONSILLECTOMY     1980s    SOCIAL HISTORY: Social History   Socioeconomic History   Marital status: Married    Spouse name: Not on file   Number of children: Not on file   Years of education: Not on file   Highest education level: Not on file  Occupational History   Not on file  Tobacco Use   Smoking status: Never   Smokeless tobacco: Never  Vaping Use   Vaping status: Never Used  Substance and Sexual Activity   Alcohol use: No   Drug use: No   Sexual activity: Yes    Birth control/protection: Post-menopausal    Comment: husband only  Other Topics Concern   Not on file  Social History Narrative   Married. 3 children- 40, 38, 25 in 2025- 2 sons and a daughter. No grandkids yet.       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 drug abuse (children had gone there)   Social Drivers of Corporate investment banker Strain: Not on file  Food Insecurity: No Food Insecurity (01/16/2024)   Hunger Vital Sign    Worried About Running Out of Food in the Last Year: Never true  Ran Out of Food in the Last Year: Never true  Transportation Needs: No Transportation Needs (01/16/2024)   PRAPARE - Administrator, Civil Service (Medical): No    Lack of Transportation (Non-Medical): No  Physical Activity: Not on file  Stress: Not on file  Social Connections: Not on file  Intimate Partner Violence: Not At Risk (01/16/2024)   Humiliation, Afraid, Rape, and Kick questionnaire    Fear of Current or Ex-Partner: No    Emotionally Abused: No    Physically Abused: No    Sexually Abused: No    FAMILY HISTORY: Family History   Problem Relation Age of Onset   Stroke Mother    Lymphoma Father    Drug abuse Daughter    Depression Daughter    Bipolar disorder Daughter    Breast cancer Maternal Aunt 15 - 3   Breast cancer Paternal Aunt 38 - 7   Stroke Maternal Grandmother    Stroke Paternal Grandfather    Diabetes Brother    Hyperlipidemia Brother    Kidney disease Brother        dehydration and stopped dm meds etc- thankfully had recovery   Depression Brother    Drug abuse Brother    Drug abuse Son    Depression Son    BRCA 1/2 Neg Hx     ALLERGIES:  has no known allergies.  MEDICATIONS:  No current outpatient medications on file.   No current facility-administered medications for this visit.    REVIEW OF SYSTEMS:   Constitutional: ( - ) fevers, ( - )  chills , ( - ) night sweats Eyes: ( - ) blurriness of vision, ( - ) double vision, ( - ) watery eyes Ears, nose, mouth, throat, and face: ( - ) mucositis, ( - ) sore throat Respiratory: ( - ) cough, ( - ) dyspnea, ( - ) wheezes Cardiovascular: ( - ) palpitation, ( - ) chest discomfort, ( - ) lower extremity swelling Gastrointestinal:  ( - ) nausea, ( - ) heartburn, ( - ) change in bowel habits Skin: ( - ) abnormal skin rashes Lymphatics: ( - ) new lymphadenopathy, ( - ) easy bruising Neurological: ( - ) numbness, ( - ) tingling, ( - ) new weaknesses Behavioral/Psych: ( - ) mood change, ( - ) new changes  All other systems were reviewed with the patient and are negative.  PHYSICAL EXAMINATION: ECOG PERFORMANCE STATUS: 1 - Symptomatic but completely ambulatory  Vitals:   02/01/24 1043  BP: (!) 143/83  Pulse: 75  Resp: 15  Temp: 98.7 F (37.1 C)  SpO2: 98%   Filed Weights   02/01/24 1043  Weight: 197 lb 12.8 oz (89.7 kg)    GENERAL: alert, no distress and comfortable SKIN: skin color, texture, turgor are normal, no rashes or significant lesions EYES: conjunctiva are pink and non-injected, sclera clear OROPHARYNX: no exudate, no  erythema; lips, buccal mucosa, and tongue normal  NECK: supple, non-tender LYMPH:  no palpable lymphadenopathy in the cervical, axillary or inguinal LUNGS: clear to auscultation and percussion with normal breathing effort HEART: regular rate & rhythm and no murmurs and no lower extremity edema ABDOMEN: soft, non-tender, non-distended, normal bowel sounds Musculoskeletal: no cyanosis of digits and no clubbing  PSYCH: alert & oriented x 3, fluent speech NEURO: no focal motor/sensory deficits  LABORATORY DATA:  I have reviewed the data as listed    Latest Ref Rng & Units 02/01/2024    9:54 AM 01/16/2024  2:29 PM 12/06/2023   11:06 AM  CBC  WBC 4.0 - 10.5 K/uL 6.3  5.9  5.6   Hemoglobin 12.0 - 15.0 g/dL 60.4  54.0  98.1   Hematocrit 36.0 - 46.0 % 37.7  36.9  40.0   Platelets 150 - 400 K/uL 206  223  255.0        Latest Ref Rng & Units 02/01/2024    9:54 AM 01/16/2024    2:29 PM 12/06/2023   11:06 AM  CMP  Glucose 70 - 99 mg/dL 191  478  98   BUN 8 - 23 mg/dL 16  13  17    Creatinine 0.44 - 1.00 mg/dL 2.95  6.21  3.08   Sodium 135 - 145 mmol/L 141  142  139   Potassium 3.5 - 5.1 mmol/L 4.4  4.3  4.0   Chloride 98 - 111 mmol/L 108  106  103   CO2 22 - 32 mmol/L 29  30  25    Calcium 8.9 - 10.3 mg/dL 8.8  9.3  9.6   Total Protein 6.5 - 8.1 g/dL 6.6  6.8  7.2   Total Bilirubin 0.0 - 1.2 mg/dL 0.3  0.3  0.4   Alkaline Phos 38 - 126 U/L 84  60  73   AST 15 - 41 U/L 50  28  31   ALT 0 - 44 U/L 46  24  28     RADIOGRAPHIC STUDIES: I have personally reviewed the radiological images as listed and agreed with the findings in the report. NM PET Image Initial (PI) Skull Base To Thigh (F-18 FDG) Result Date: 01/31/2024 CLINICAL DATA:  Initial treatment strategy for grade 3A follicular lymphoma of cervical nodes. EXAM: NUCLEAR MEDICINE PET SKULL BASE TO THIGH TECHNIQUE: 9.8 mCi F-18 FDG was injected intravenously. Full-ring PET imaging was performed from the skull base to thigh after the  radiotracer. CT data was obtained and used for attenuation correction and anatomic localization. Fasting blood glucose: 111 mg/dl COMPARISON:  Neck CT 65/78/4696.  Abdominopelvic CT 04/14/2012. FINDINGS: Mediastinal blood pool activity: SUV max 2.7 Liver activity: SUV max 4.0 NECK: Bilateral hypermetabolic cervical nodes. Right-sided level 2 nodal mass measures 4.0 x 3.1 cm and a S.U.V. max of 10.6 on 23/4. Incidental CT findings: Deferred to recent diagnostic CT. CHEST: No pulmonary parenchymal hypermetabolism. Mediastinal and hilar nodal hypermetabolism. Example subcarinal node at 2.2 cm and a S.U.V. max of 7.2 on 61/4. Incidental CT findings: Mild cardiomegaly. Aortic atherosclerosis. Ascending aorta measures 4.0 cm. ABDOMEN/PELVIS: Mild splenic hypermetabolism at a S.U.V. max of 6.1. No splenomegaly. Extensive abdominopelvic nodal hypermetabolism. Example left periaortic nodal conglomerate at 1.9 cm and a S.U.V. max of 8.6 on 118/4. Nodes posterior to the cecum measure up to 12 mm and a S.U.V. max of 8.3 on 134/4. Index left external iliac node measures 2.5 cm and a S.U.V. max of 10.5 on 161/4. Incidental CT findings: Normal adrenal glands. Extensive colonic diverticulosis. Pelvic floor laxity. Pessary. Trace free pelvic fluid. SKELETON: No abnormal marrow activity. Incidental CT findings: None. IMPRESSION: 1. Evidence of active lymphoma within the neck, chest, abdomen, and pelvis. ([place]) 5 2. Mild splenic hypermetabolism, suspicious for lymphomatous involvement. 3. Ascending aorta of 4.0 cm. This could either be re-evaluated on follow-up routine CT/PET or characterized with follow-up CTA in 1 year. 4. Incidental findings, including: Aortic Atherosclerosis (ICD10-I70.0). Trace free pelvic fluid. Electronically Signed   By: Lore Rode M.D.   On: 01/31/2024 18:30    ASSESSMENT &  PLAN Ayshia Gramlich 68 y.o. female with medical history significant for follicular lymphoma, grade 3A, stage III who  presents for a follow up visit.  After review of the labs, review of the records, and discussion with the patient the patients findings are most consistent with high-grade follicular lymphoma, staging in process.   Previously we discussed the nature of follicular lymphoma and steps moving forward.  We discussed that it tends to be a more indolent lymphoma and the treatment may not be required immediately.  We discussed criteria for treatment including bulky disease as well as rapid progression.  Additionally we discussed that her blood counts appear stable at last check, we will recheck these today.  Pending results that scan we will will determine next best steps moving forward.  The patient voiced understanding of our findings, the nature of the disease, and the plan moving forward.    # Follicular Lymphoma grade 3a, Staging in Process -- Needle core biopsy confirmed follicular lymphoma, grade 3A (high-grade). -- PET CT scan confirms stage III disease with involvement on both sides of the diaphragm.  No evidence of organ invasion or bone marrow involvement -- Baseline labs are adequate for treatment. -- Will need excisional biopsy, reached out to ENT today. --Will schedule port placement --At this time considering monotherapy rituximab versus Bendamustine and rituximab.  Both would be effective treatments.  Given her young age I do believe the combination of chemotherapy and immunotherapy would be the best option, though immunotherapy alone would be reasonable.  Presented these options to the patient today. -- Plan for return to clinic pending the results of the above studies.  Orders Placed This Encounter  Procedures   IR IMAGING GUIDED PORT INSERTION    Standing Status:   Future    Expected Date:   02/08/2024    Expiration Date:   01/31/2025    Reason for Exam (SYMPTOM  OR DIAGNOSIS REQUIRED):   requesting port of administration of chemotherapy    Preferred Imaging Location?:   [facility]    All questions were answered. The patient knows to call the clinic with any problems, questions or concerns.  A total of more than 30 minutes were spent on this encounter with face-to-face time and non-face-to-face time, including preparing to see the patient, ordering tests and/or medications, counseling the patient and coordination of care as outlined above.   Rogerio Clay, MD Department of Hematology/Oncology [facility] at [facility] Phone: [phone] Pager: [phone] Email: [email or web address]  02/03/2024 5:11 PM

## 2024-02-01 NOTE — Telephone Encounter (Signed)
 ENT Note: Contacted by Heme/Onc regarding need for excisional bx/open bx of lymph node. Discussed with patient as well re: R/B/A for surgery, including pain, bleeding, infection, numbness, risks to major vascular structures and life threatening bleed, tongue or lip weakness, chyle leak, injury to vagus or phrenic nerve, change in speech or swallowing, need for further procedures or treatment. We also discussed post-op management and expectations including possible need for drain (rubber band or suction) She understands all of this and is anxious to proceed  Will expedite her care given carcinoma  Tylasia Fletchall B Geordie Nooney

## 2024-02-02 LAB — HEPATITIS B CORE ANTIBODY, TOTAL: HEP B CORE AB: NEGATIVE

## 2024-02-05 ENCOUNTER — Encounter (HOSPITAL_COMMUNITY): Payer: Self-pay

## 2024-02-05 ENCOUNTER — Other Ambulatory Visit: Payer: Self-pay

## 2024-02-05 NOTE — Progress Notes (Signed)
 SDW call  Patient was given pre-op instructions over the phone. Patient verbalized understanding of instructions provided.     PCP - Dr. Clarisa Crooked Hem/Onc: Dr. Coni Deep Cardiologist -  Pulmonary:    PPM/ICD - denies Device Orders - na Rep Notified - na   Chest x-ray - na EKG -  na Stress Test - ECHO -  Cardiac Cath -   Sleep Study/sleep apnea/CPAP: denies  Non-diabetic  Blood Thinner Instructions: denies Aspirin  Instructions:denies   ERAS Protcol - Clears until 1000   Anesthesia review: No   Patient denies shortness of breath, fever, cough and chest pain over the phone call  Your procedure is scheduled on Wednesday February 06, 2024  Report to [facility] Main Entrance "A" at  1030  A.M., then check in with the Admitting office.  Call this number if you have problems the morning of surgery:  [phone]   If you have any questions prior to your surgery date call [phone]: Open Monday-Friday 8am-4pm If you experience any cold or flu symptoms such as cough, fever, chills, shortness of breath, etc. between now and your scheduled surgery, please notify us  at the above number    Remember:  Do not eat after midnight the night before your surgery you may drink clear liquids until 1000 the                   morning of your surgery.   Clear liquids allowed are: Water, Non-Citrus Juices (without pulp), Carbonated Beverages, Clear Tea, Black Coffee ONLY (NO MILK, CREAM OR POWDERED CREAMER of any kind), and Gatorade   Take these medicines the morning of surgery with A SIP OF WATER: None  As of today, STOP taking any Aspirin  (unless otherwise instructed by your surgeon) Aleve, Naproxen, Ibuprofen , Motrin , Advil , Goody's, BC's, all herbal medications, fish oil, and all vitamins.

## 2024-02-06 ENCOUNTER — Other Ambulatory Visit: Payer: Self-pay

## 2024-02-06 ENCOUNTER — Ambulatory Visit (HOSPITAL_COMMUNITY): Admitting: Anesthesiology

## 2024-02-06 ENCOUNTER — Encounter (HOSPITAL_COMMUNITY)
Admission: RE | Disposition: A | Discharge status: Home / Self Care | Payer: Self-pay | Source: Home / Self Care | Attending: Otolaryngology

## 2024-02-06 ENCOUNTER — Ambulatory Visit (HOSPITAL_COMMUNITY)
Admission: RE | Admit: 2024-02-06 | Discharge: 2024-02-06 | Disposition: A | Discharge status: Home / Self Care | Attending: Otolaryngology | Admitting: Otolaryngology

## 2024-02-06 ENCOUNTER — Encounter (HOSPITAL_COMMUNITY): Payer: Self-pay

## 2024-02-06 DIAGNOSIS — R590 Localized enlarged lymph nodes: Secondary | ICD-10-CM

## 2024-02-06 DIAGNOSIS — C8291 Follicular lymphoma, unspecified, lymph nodes of head, face, and neck: Secondary | ICD-10-CM | POA: Insufficient documentation

## 2024-02-06 DIAGNOSIS — C8221 Follicular lymphoma grade III, unspecified, lymph nodes of head, face, and neck: Secondary | ICD-10-CM

## 2024-02-06 DIAGNOSIS — R591 Generalized enlarged lymph nodes: Secondary | ICD-10-CM

## 2024-02-06 HISTORY — PX: LYMPH NODE BIOPSY: SHX201

## 2024-02-06 SURGERY — LYMPH NODE BIOPSY
Anesthesia: General | Site: Neck | Laterality: Right

## 2024-02-06 MED ORDER — CHLORHEXIDINE GLUCONATE 0.12 % MT SOLN: 15.0000 mL | Freq: Once | OROMUCOSAL | Status: AC

## 2024-02-06 MED ORDER — LIDOCAINE-EPINEPHRINE 1 %-1:100000 IJ SOLN
INTRAMUSCULAR | Status: DC | PRN
  Administered 2024-02-06: 3 mL

## 2024-02-06 MED ORDER — MIDAZOLAM HCL 2 MG/2ML IJ SOLN
INTRAMUSCULAR | Status: DC | PRN
  Administered 2024-02-06: 2 mg via INTRAVENOUS

## 2024-02-06 MED ORDER — OXYCODONE HCL 5 MG PO TABS
5.0000 mg | ORAL_TABLET | Freq: Once | ORAL | Status: AC | PRN
  Administered 2024-02-06: 5 mg via ORAL

## 2024-02-06 MED ORDER — LACTATED RINGERS IV SOLN: INTRAVENOUS | Status: DC

## 2024-02-06 MED ORDER — BACITRACIN ZINC 500 UNIT/GM EX OINT: TOPICAL_OINTMENT | CUTANEOUS | 0 refills | Status: AC

## 2024-02-06 MED ORDER — KETOROLAC TROMETHAMINE 30 MG/ML IJ SOLN
30.0000 mg | Freq: Once | INTRAMUSCULAR | Status: AC | PRN
  Administered 2024-02-06: 30 mg via INTRAVENOUS

## 2024-02-06 MED ORDER — PROPOFOL 10 MG/ML IV BOLUS
INTRAVENOUS | Status: AC
  Filled 2024-02-06: qty 20

## 2024-02-06 MED ORDER — BACITRACIN ZINC 500 UNIT/GM EX OINT
TOPICAL_OINTMENT | CUTANEOUS | Status: DC | PRN
  Administered 2024-02-06: 1 via TOPICAL

## 2024-02-06 MED ORDER — HYDROMORPHONE HCL 1 MG/ML IJ SOLN: 0.2500 mg | INTRAMUSCULAR | Status: DC | PRN

## 2024-02-06 MED ORDER — PROPOFOL 10 MG/ML IV BOLUS
INTRAVENOUS | Status: DC | PRN
  Administered 2024-02-06: 150 mg via INTRAVENOUS
  Administered 2024-02-06: 50 mg via INTRAVENOUS

## 2024-02-06 MED ORDER — CEFAZOLIN SODIUM 1 G IJ SOLR
INTRAMUSCULAR | Status: AC
  Filled 2024-02-06: qty 20

## 2024-02-06 MED ORDER — FENTANYL CITRATE (PF) 250 MCG/5ML IJ SOLN
INTRAMUSCULAR | Status: AC
  Filled 2024-02-06: qty 5

## 2024-02-06 MED ORDER — DEXAMETHASONE SODIUM PHOSPHATE 10 MG/ML IJ SOLN
INTRAMUSCULAR | Status: AC
  Filled 2024-02-06: qty 1

## 2024-02-06 MED ORDER — KETOROLAC TROMETHAMINE 30 MG/ML IJ SOLN
INTRAMUSCULAR | Status: AC
  Filled 2024-02-06: qty 1

## 2024-02-06 MED ORDER — FENTANYL CITRATE (PF) 250 MCG/5ML IJ SOLN
INTRAMUSCULAR | Status: DC | PRN
  Administered 2024-02-06 (×2): 50 ug via INTRAVENOUS

## 2024-02-06 MED ORDER — IBUPROFEN 200 MG PO TABS: 400.0000 mg | ORAL_TABLET | Freq: Four times a day (QID) | ORAL | 0 refills | Status: AC | PRN

## 2024-02-06 MED ORDER — SUCCINYLCHOLINE CHLORIDE 200 MG/10ML IV SOSY
PREFILLED_SYRINGE | INTRAVENOUS | Status: AC
  Filled 2024-02-06: qty 30

## 2024-02-06 MED ORDER — ACETAMINOPHEN 10 MG/ML IV SOLN
INTRAVENOUS | Status: DC | PRN
Start: 2024-02-06 — End: 2024-02-06
  Administered 2024-02-06: 1000 mg via INTRAVENOUS

## 2024-02-06 MED ORDER — LIDOCAINE-EPINEPHRINE 1 %-1:100000 IJ SOLN
INTRAMUSCULAR | Status: AC
  Filled 2024-02-06: qty 1

## 2024-02-06 MED ORDER — SUCCINYLCHOLINE CHLORIDE 200 MG/10ML IV SOSY
PREFILLED_SYRINGE | INTRAVENOUS | Status: DC | PRN
  Administered 2024-02-06: 100 mg via INTRAVENOUS

## 2024-02-06 MED ORDER — BACITRACIN ZINC 500 UNIT/GM EX OINT
TOPICAL_OINTMENT | CUTANEOUS | Status: AC
  Filled 2024-02-06: qty 28.35

## 2024-02-06 MED ORDER — ORAL CARE MOUTH RINSE: 15.0000 mL | Freq: Once | OROMUCOSAL | Status: AC

## 2024-02-06 MED ORDER — CHLORHEXIDINE GLUCONATE 0.12 % MT SOLN
OROMUCOSAL | Status: AC
  Administered 2024-02-06: 15 mL via OROMUCOSAL
  Filled 2024-02-06: qty 15

## 2024-02-06 MED ORDER — OXYCODONE HCL 5 MG PO TABS: 5.0000 mg | ORAL_TABLET | ORAL | 0 refills | Status: AC | PRN

## 2024-02-06 MED ORDER — ONDANSETRON HCL 4 MG/2ML IJ SOLN
INTRAMUSCULAR | Status: DC | PRN
  Administered 2024-02-06: 4 mg via INTRAVENOUS

## 2024-02-06 MED ORDER — MIDAZOLAM HCL 2 MG/2ML IJ SOLN
INTRAMUSCULAR | Status: AC
  Filled 2024-02-06: qty 2

## 2024-02-06 MED ORDER — ACETAMINOPHEN 500 MG PO TABS: 1000.0000 mg | ORAL_TABLET | Freq: Once | ORAL | Status: DC

## 2024-02-06 MED ORDER — ONDANSETRON HCL 4 MG/2ML IJ SOLN
INTRAMUSCULAR | Status: AC
  Filled 2024-02-06: qty 2

## 2024-02-06 MED ORDER — OXYCODONE HCL 5 MG PO TABS
ORAL_TABLET | ORAL | Status: AC
  Filled 2024-02-06: qty 1

## 2024-02-06 MED ORDER — HEMOSTATIC AGENTS (NO CHARGE) OPTIME
TOPICAL | Status: DC | PRN
  Administered 2024-02-06: 1 via TOPICAL

## 2024-02-06 MED ORDER — ONDANSETRON HCL 4 MG/2ML IJ SOLN: 4.0000 mg | Freq: Once | INTRAMUSCULAR | Status: DC | PRN

## 2024-02-06 MED ORDER — OXYCODONE HCL 5 MG/5ML PO SOLN: 5.0000 mg | Freq: Once | ORAL | Status: AC | PRN

## 2024-02-06 MED ORDER — ACETAMINOPHEN 500 MG PO TABS: 1000.0000 mg | ORAL_TABLET | Freq: Four times a day (QID) | ORAL | 0 refills | Status: AC | PRN

## 2024-02-06 MED ORDER — PHENYLEPHRINE HCL-NACL 20-0.9 MG/250ML-% IV SOLN
INTRAVENOUS | Status: DC | PRN
  Administered 2024-02-06: 45 ug/min via INTRAVENOUS

## 2024-02-06 MED ORDER — 0.9 % SODIUM CHLORIDE (POUR BTL) OPTIME
TOPICAL | Status: DC | PRN
  Administered 2024-02-06: 1000 mL

## 2024-02-06 MED ORDER — CEPHALEXIN 500 MG PO CAPS: 500.0000 mg | ORAL_CAPSULE | Freq: Two times a day (BID) | ORAL | 0 refills | Status: AC

## 2024-02-06 MED ORDER — LIDOCAINE 2% (20 MG/ML) 5 ML SYRINGE
INTRAMUSCULAR | Status: DC | PRN
  Administered 2024-02-06: 60 mg via INTRAVENOUS

## 2024-02-06 MED ORDER — CEFAZOLIN SODIUM-DEXTROSE 2-3 GM-%(50ML) IV SOLR
INTRAVENOUS | Status: DC | PRN
  Administered 2024-02-06: 2 g via INTRAVENOUS

## 2024-02-06 MED ORDER — PHENYLEPHRINE 80 MCG/ML (10ML) SYRINGE FOR IV PUSH (FOR BLOOD PRESSURE SUPPORT)
PREFILLED_SYRINGE | INTRAVENOUS | Status: DC | PRN
  Administered 2024-02-06 (×7): 80 ug via INTRAVENOUS

## 2024-02-06 SURGICAL SUPPLY — 54 items
BAG COUNTER SPONGE SURGICOUNT (BAG) ×1 IMPLANT
BLADE SURG 15 STRL LF DISP TIS (BLADE) IMPLANT
CANISTER SUCTION 3000ML PPV (SUCTIONS) ×1 IMPLANT
CLEANER TIP ELECTROSURG 2X2 (MISCELLANEOUS) IMPLANT
CLIP TI MEDIUM 24 (CLIP) ×1 IMPLANT
CLIP TI WIDE RED SMALL 24 (CLIP) ×1 IMPLANT
CNTNR URN SCR LID CUP LEK RST (MISCELLANEOUS) IMPLANT
CORD BIPOLAR FORCEPS 12FT (ELECTRODE) ×1 IMPLANT
COVER SURGICAL LIGHT HANDLE (MISCELLANEOUS) ×1 IMPLANT
DERMABOND ADVANCED .7 DNX12 (GAUZE/BANDAGES/DRESSINGS) ×1 IMPLANT
DRAIN PENROSE 12X.25 LTX STRL (MISCELLANEOUS) IMPLANT
DRAPE HALF SHEET 40X57 (DRAPES) IMPLANT
DRAPE INCISE 23X17 STRL (DRAPES) IMPLANT
DRAPE INCISE IOBAN 23X17 STRL (DRAPES) ×1 IMPLANT
DRSG TEGADERM 2-3/8X2-3/4 SM (GAUZE/BANDAGES/DRESSINGS) ×1 IMPLANT
DRSG TEGADERM 4X4.75 (GAUZE/BANDAGES/DRESSINGS) ×1 IMPLANT
ELECT COATED BLADE 2.86 ST (ELECTRODE) ×1 IMPLANT
ELECTRODE REM PT RTRN 9FT ADLT (ELECTROSURGICAL) ×1 IMPLANT
EVACUATOR SILICONE 100CC (DRAIN) IMPLANT
FORCEPS BIPOLAR SPETZLER 8 1.0 (NEUROSURGERY SUPPLIES) IMPLANT
GAUZE 4X4 16PLY ~~LOC~~+RFID DBL (SPONGE) IMPLANT
GAUZE SPONGE 4X4 12PLY STRL (GAUZE/BANDAGES/DRESSINGS) IMPLANT
GLOVE BIO SURGEON STRL SZ7.5 (GLOVE) ×1 IMPLANT
GLOVE BIOGEL PI IND STRL 8 (GLOVE) ×1 IMPLANT
GOWN STRL REUS W/ TWL LRG LVL3 (GOWN DISPOSABLE) ×2 IMPLANT
GOWN STRL REUS W/ TWL XL LVL3 (GOWN DISPOSABLE) ×1 IMPLANT
HEMOSTAT SNOW SURGICEL 2X4 (HEMOSTASIS) IMPLANT
KIT BASIN OR (CUSTOM PROCEDURE TRAY) ×1 IMPLANT
KIT TURNOVER KIT B (KITS) ×1 IMPLANT
LOCATOR NERVE 3 VOLT (DISPOSABLE) IMPLANT
NDL 27GX1/2 REG BEVEL ECLIP (NEEDLE) ×1 IMPLANT
NEEDLE 27GX1/2 REG BEVEL ECLIP (NEEDLE) ×1 IMPLANT
NS IRRIG 1000ML POUR BTL (IV SOLUTION) ×1 IMPLANT
PAD ARMBOARD POSITIONER FOAM (MISCELLANEOUS) ×2 IMPLANT
PENCIL SMOKE EVACUATOR (MISCELLANEOUS) ×1 IMPLANT
SPECIMEN JAR MEDIUM (MISCELLANEOUS) IMPLANT
SPONGE INTESTINAL PEANUT (DISPOSABLE) ×1 IMPLANT
STAPLER SKIN PROX 35W (STAPLE) ×1 IMPLANT
STRIP CLOSURE SKIN 1/2X4 (GAUZE/BANDAGES/DRESSINGS) IMPLANT
SUT MON AB 4-0 PC3 18 (SUTURE) ×1 IMPLANT
SUT PLAIN GUT FAST 5-0 (SUTURE) IMPLANT
SUT SILK 2 0 REEL (SUTURE) IMPLANT
SUT SILK 3 0 REEL (SUTURE) ×1 IMPLANT
SUT SILK 3 0 SH CR/8 (SUTURE) IMPLANT
SUT SILK 4 0 REEL (SUTURE) IMPLANT
SUT VIC AB 3-0 SH 8-18 (SUTURE) ×1 IMPLANT
SUT VIC AB 4-0 PC1 18 (SUTURE) IMPLANT
SUT VIC AB 4-0 RB1 18 (SUTURE) IMPLANT
SYR 10ML LL (SYRINGE) IMPLANT
TOWEL GREEN STERILE FF (TOWEL DISPOSABLE) ×1 IMPLANT
TRAY ENT MC OR (CUSTOM PROCEDURE TRAY) ×1 IMPLANT
TRAY FOLEY MTR SLVR 14FR STAT (SET/KITS/TRAYS/PACK) IMPLANT
TUBE 10FR 43IN 5G WT TIP ENFIT (TUBING) IMPLANT
WATER STERILE IRR 1000ML POUR (IV SOLUTION) ×1 IMPLANT

## 2024-02-06 NOTE — Anesthesia Preprocedure Evaluation (Signed)
 Anesthesia Evaluation  Patient identified by MRN, date of birth, ID band Patient awake    Reviewed: Allergy & Precautions, NPO status , Patient's Chart, lab work & pertinent test results  History of Anesthesia Complications Negative for: history of anesthetic complications  Airway Mallampati: II  TM Distance: >3 FB Neck ROM: Full    Dental no notable dental hx. (+) Teeth Intact, Dental Advisory Given   Pulmonary    Pulmonary exam normal breath sounds clear to auscultation       Cardiovascular (-) hypertension(-) angina (-) Past MI Normal cardiovascular exam Rhythm:Regular Rate:Normal     Neuro/Psych    GI/Hepatic   Endo/Other    Renal/GU Lab Results      Component                Value               Date                      NA                       141                 02/01/2024                CL                       108                 02/01/2024                K                        4.4                 02/01/2024                CO2                      29                  02/01/2024                BUN                      16                  02/01/2024                CREATININE               0.71                02/01/2024                GFRNONAA                 >60                 02/01/2024                CALCIUM                  8.8 (L)             02/01/2024  ALBUMIN                  4.2                 02/01/2024                GLUCOSE                  109 (H)             02/01/2024                Musculoskeletal negative musculoskeletal ROS (+)    Abdominal   Peds  Hematology Lab Results      Component                Value               Date                      WBC                      6.3                 02/01/2024                HGB                      12.8                02/01/2024                HCT                      37.7                02/01/2024                MCV                       91.7                02/01/2024                PLT                      206                 02/01/2024              Anesthesia Other Findings   Reproductive/Obstetrics                             Anesthesia Physical Anesthesia Plan  ASA: 1  Anesthesia Plan: General   Post-op Pain Management: Tylenol  PO (pre-op)*   Induction: Intravenous  PONV Risk Score and Plan: 4 or greater and Treatment may vary due to age or medical condition, Midazolam , Ondansetron  and Dexamethasone   Airway Management Planned: Oral ETT  Additional Equipment: None  Intra-op Plan:   Post-operative Plan: Extubation in OR  Informed Consent: I have reviewed the patients History and Physical, chart, labs and discussed the procedure including the risks, benefits and alternatives for the proposed anesthesia with the patient or authorized representative who has indicated his/her understanding and acceptance.     Dental advisory given  Plan Discussed with:   Anesthesia Plan Comments:  Anesthesia Quick Evaluation

## 2024-02-06 NOTE — Anesthesia Procedure Notes (Signed)
 Procedure Name: Intubation Date/Time: 02/06/2024 1:07 PM  Performed by: Gabe Jock, CRNAPre-anesthesia Checklist: Patient identified, Emergency Drugs available, Suction available and Patient being monitored Patient Re-evaluated:Patient Re-evaluated prior to induction Oxygen Delivery Method: Circle System Utilized Preoxygenation: Pre-oxygenation with 100% oxygen Induction Type: IV induction Ventilation: Mask ventilation without difficulty Laryngoscope Size: Mac and 4 Grade View: Grade I Tube type: Oral Tube size: 7.0 mm Number of attempts: 2 Airway Equipment and Method: Stylet Placement Confirmation: ETT inserted through vocal cords under direct vision, positive ETCO2 and breath sounds checked- equal and bilateral Secured at: 22 cm Tube secured with: Tape Dental Injury: Teeth and Oropharynx as per pre-operative assessment  Comments: Esophageal intubation by SRNA, recognized immediately, tube removed and mask ventilated without difficulty. DL x1 by CRNA, intubated without difficulty.

## 2024-02-06 NOTE — Op Note (Signed)
 Otolaryngology Operative note  [name] Date/Time of Admission: 02/06/2024 10:25 AM  CSN: [id number];MRN:[id number]  DOB: 18-Nov-1955 Age: 68 y.o. 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  Post-Op Diagnosis: Same  Procedure: Procedure(s): Excisional Lymph Node Biopsy Deep Cervical Lymph Nodes - CPT 38510  Surgeon: Milon Aloe, MD  Anesthesia type:  General  Anesthesiologist: Anesthesiologist: Rosalita Combe, MD CRNA: Gabe Jock, CRNA; Keitha Pata, CRNA Student Nurse Anesthetist: Larena Plaza, RN   Staff: Circulator: Bartley Lightning, RN Scrub Person: Perez-Vasquez, Tiffany  Implants: None  Specimens: Right neck Lymph Node - sent Fresh for Lymphoma Protocol  EBL: 25cc  Drains: Penrose drain trimmed and fashioned as a rubberband drain  Post-op disposition and condition: PACU, hemodynamically stable  Findings: Multiple right deep cervical lymph nodes which were PET Avid. A large node biopsied right neck.  Complications: None apparent  Indications and consent:  Tracy Bradley is a 68 y.o. female with bilateral cervical lymphadenopathy most palpable on right, prior biopsied as Follicular Lymphoma. The Oncology team recommended an open biopsy for further tissue diagnostic workup. The patient's options were discussed, including risks/benefits/alternatives for each option. Patient expressed understanding, and despite these risks, consented and decided to proceed with above procedures. Informed consent was signed before proceeding.  Procedure:  The patient was identified in the preoperative area, consent confirmed, transported to the operating suite. They were transferred to the operating room table and placed in a supine position. After induction of general endotracheal anesthesia and establishment of airway, a surgeon initiated time out was performed.   The patient was prepped and draped in the usual  fashion for this procedure. Intraoperative antibiotics were administered but intraoperative steroids were NOT administered. A marking pen was used to make a 4 cm line in the direction of relaxed skin tension lines overlying the lymph node of interest located over the right cervical neck. The incision line was injected with 5cc of 1% lidocaine  with 1:100,000 epinephrine.    A sharp knife was used to incise through the skin and dermis. Limited subplatysmal flaps was raised superiorly. Gentle dissection was used working toward the lymph node which was deep to platysma, located over level 1b. The capsule was identified and the surrounding tissue was dissected free using bipolar cautery and dissection using a Camera operator. Care was taken to dissect this off the node intact. After removal of the node, the wound bed was thoroughly irrigated with normal saline. Small amount of surgicel SNoW was placed and a trimmed penrose was placed in the wound bed but not secured (in a rubberband drain style fashion). The platysma was closed with 3-0 interrupted vicryl. The deep dermis was closed with interrupted 4-0 vicryl suture in an interrupted fashion. The skin was closed with 5-0 fast gut. After cleaning and drying the skin, bacitracin was applied to the incision. A sponge and telfa dressing was applied.   With the surgical portion of the procedure complete, all instrumentation was then removed from the operative field. Patient was returned to the care of the anesthesia team. Patient was then weaned from the anesthetic and transported to the PACU in stable condition.   Tracy Bradley B Tracy Bradley

## 2024-02-06 NOTE — Anesthesia Postprocedure Evaluation (Signed)
 Anesthesia Post Note  Patient: Tracy Bradley  Procedure(s) Performed: RIGHT NECK CERVICAL LYMPH NODE BIOPSY (Right: Neck)     Patient location during evaluation: PACU Anesthesia Type: General Level of consciousness: awake and alert Pain management: pain level controlled Vital Signs Assessment: post-procedure vital signs reviewed and stable Respiratory status: spontaneous breathing, nonlabored ventilation, respiratory function stable and patient connected to nasal cannula oxygen Cardiovascular status: blood pressure returned to baseline and stable Postop Assessment: no apparent nausea or vomiting Anesthetic complications: no  No notable events documented.  Last Vitals:  Vitals:   02/06/24 1500 02/06/24 1515  BP: (!) 144/79 132/78  Pulse: 67 69  Resp: 18 19  Temp:  36.7 C  SpO2: 92% 93%    Last Pain:  Vitals:   02/06/24 1415  TempSrc:   PainSc: 0-No pain                 Rosalita Combe

## 2024-02-06 NOTE — Transfer of Care (Signed)
 Immediate Anesthesia Transfer of Care Note  Patient: Tracy Bradley  Procedure(s) Performed: RIGHT NECK CERVICAL LYMPH NODE BIOPSY (Right: Neck)  Patient Location: PACU  Anesthesia Type:General  Level of Consciousness: awake and alert   Airway & Oxygen Therapy: Patient Spontanous Breathing  Post-op Assessment: Report given to RN and Post -op Vital signs reviewed and stable  Post vital signs: Reviewed and stable  Last Vitals:  Vitals Value Taken Time  BP 139/73 02/06/24 1415  Temp 98.2   Pulse 94 02/06/24 1418  Resp 17 02/06/24 1418  SpO2 92 % 02/06/24 1418  Vitals shown include unfiled device data.  Last Pain:  Vitals:   02/06/24 1104  TempSrc:   PainSc: 0-No pain         Complications: No notable events documented.

## 2024-02-06 NOTE — H&P (Signed)
 Pre-Operative H&P - Day Of Surgery Patient Name: Tracy Bradley Date:   02/06/2024  HPI: Tracy Bradley is a 68 y.o. female who presents today for operative treatment of right neck lymphadenopathy, lymphoma. Patient denies recent significant changes to health or significant new medications or physiologic change in condition which would immediately impact plans. No new types of therapy has been initiated that would change the plan or the appropriateness of the plan.   ROS:  A complete review of systems was obtained and is otherwise negative.   PMH:  Past Medical History:  Diagnosis Date   Anemia    since childhood   Ileus following gastrointestinal surgery (HCC) 04/22/2012   Muscle strain 04/22/2012    PSH:  Past Surgical History:  Procedure Laterality Date   BACK SURGERY     1980- low back ruptured disk   LAPAROSCOPIC APPENDECTOMY  04/14/2012   Procedure: APPENDECTOMY LAPAROSCOPIC;  Surgeon: Thayne Fine, MD;  Location: [facility] ORS;  Service: General;  Laterality: N/A;   TONSILLECTOMY     1980s    MEDS:   Current Facility-Administered Medications:    acetaminophen  (TYLENOL ) tablet 1,000 mg, 1,000 mg, Oral, Once, Houser, Lenis Quin, MD   lactated ringers  infusion, , Intravenous, Continuous, Rosalita Combe, MD, Last Rate: 10 mL/hr at 02/06/24 1122, New Bag at 02/06/24 1122  ALLERGIES: Patient has no known allergies.  EXAM: Vitals: BP 124/82   Pulse 74   Temp 98.1 F (36.7 C) (Oral)   Resp 17   Ht 5' 8.5 (1.74 m)   Wt 88.9 kg   SpO2 97%   BMI 29.37 kg/m   General Awake, at baseline alertness.   HEENT No scleral icterus or conjunctival hemorrhage. Globe position appears normal. External ears  normal. Nose patent without rhinorrhea. Bilateral neck lymphadenopathy - right level 2 node  Cardiovascular No cyanosis.  Pulmonary No audible stridor. Breathing easily with no labor.  Neuro Symmetric facial movement.   Psychiatry Appropriate affect and mood.  Skin No scars or lesions  on face or neck.  Extermities Moves all extremities with normal range of motion.   Other Findings None.   Assessment & Plan: [name] has diagnoses of right neck lymphadenopathy, lymphoma and will go to the OR today for right neck excisional lymph node biopsy. Informed consent was obtained and available in EMR today. All questions have been answered, and risks/benefits/alternatives of procedure as noted in the consent were discussed in a quiet area. Questions were invited and answered. The patient expressed understanding, provided consent and wished to proceed despite risks.  Evelina Hippo 02/06/2024 12:28 PM

## 2024-02-06 NOTE — Discharge Instructions (Addendum)
 Post Anesthesia Guidelines  As you recover from anesthesia  You may feel drowsy and reflexes may be slowed for 24 hours:  -Do not drive, use machinery, appliances, ride bicycles or scooters  -Do not consume alcohol  -Do not make important decisions   Eating and drinking:  -Drink plenty of liquids today  -Avoid fried or spicy foods today  -Return to your regular diet slowly over the next 24 hours  -Please call if unable to keep fluids down  If your throat is sore: -A breathing tube may have been used during your surgery -Drink cool liquids or gargle with warm salt water -If soreness lasts more than a few days, please call us   Surgery Discharge Instructions:  Call clinic or return to ED if you: - develop a fever greater than 101.4 - have shaking chills or are feeling ill - become short of breath - have uncontrollable nausea or vomiting - can't hold down food or liquids or feel as though you are getting dehydrated - urine output of less than 30cc/hr for 12 hours - develop significant redness, pain at incision(s) or wound opens up/separates - any other acute events, problems, or concerns  Wound Care/Dressings/Drain Instructions:  - You can take the dressing down on your neck wound tomorrow. Also pull and remove the plastic drain out (which is taped over) as well when you remove the dressing tomorrow by just pulling it. Do not let water go over the wound for first 48-72 hrs - you can start showering this Sunday. The neck wound will have some kool aid, blood or even straw colored fluid draining from the wound (small amount). That is normal - it should stop as the wound closes up.  - Apply bacitracin ointment twice per day to the wound for 5 days after you take the drain out. Then switch to aquaphor. - Your stitches are dissolvable and do not need to be removed  Medications: - Resume your regular home medications except as detailed in the medication reconciliation. For pain, take  tylenol  and ibuprofen  as prescribed. If that does not work, a stronger pain medication Oxycodone - 5mg  tablet every 4-6 hours as needed has been prescribed to take as needed. - Take keflex 500mg  tablet twice per day for 5 days -- this is to prevent infection.  Follow Up:  - We will follow up with you in about 2 weeks to check the wound. Please call us  if you do not know the exact time and date of appt within 4-5 business days   Activity/Restrictions:  - Resume your regular activities, as tolerated  Diet: - Resume your regular diet, as tolerated  Additional Instructions: - Please take an over the counter stool softener while taking narcotic pain medication - DO NOT MIX NARCOTIC PAIN MEDICATIONS OR TAKE NARCOTIC PRESCRIPTIONS AT THE SAME TIME - DO NOT DRIVE OR OPERATE HEAVY MACHINERY WHILE ON NARCOTICS  - DO NOT TAKE MORE THAN 4 GRAMS (4000mg ) OF TYLENOL  (ACETAMINOPHEN ) IN 24 HOURS  ENT Contact Info: Otolaryngology Front Desk Phone including questions about appointments or questions: [phone] - If after normal business hours (Monday-Friday after 5PM or Weekends/Holidays), please call same number and follow prompts for Patient Access Line. There is a physician on call for urgent matters. For life threatening emergencies, please call 911

## 2024-02-07 ENCOUNTER — Encounter (HOSPITAL_COMMUNITY): Payer: Self-pay | Admitting: Otolaryngology

## 2024-02-08 LAB — SURGICAL PATHOLOGY

## 2024-02-15 ENCOUNTER — Telehealth: Payer: Self-pay | Admitting: Hematology and Oncology

## 2024-02-15 NOTE — Telephone Encounter (Signed)
 Rescheduled appointments per 6/20 secure chat from [place] T. RN. Called and left a VM with appointment details for the patient.

## 2024-02-18 ENCOUNTER — Other Ambulatory Visit: Payer: Self-pay | Admitting: Hematology and Oncology

## 2024-02-18 ENCOUNTER — Inpatient Hospital Stay (HOSPITAL_BASED_OUTPATIENT_CLINIC_OR_DEPARTMENT_OTHER): Admitting: Hematology and Oncology

## 2024-02-18 ENCOUNTER — Encounter (INDEPENDENT_AMBULATORY_CARE_PROVIDER_SITE_OTHER): Admitting: Physician Assistant

## 2024-02-18 ENCOUNTER — Inpatient Hospital Stay

## 2024-02-18 VITALS — BP 138/88 | HR 77 | Temp 97.3°F | Resp 14 | Wt 198.7 lb

## 2024-02-18 DIAGNOSIS — C8231 Follicular lymphoma grade IIIa, lymph nodes of head, face, and neck: Secondary | ICD-10-CM

## 2024-02-18 DIAGNOSIS — C8238 Follicular lymphoma grade IIIa, lymph nodes of multiple sites: Secondary | ICD-10-CM

## 2024-02-18 DIAGNOSIS — C823A Follicular lymphoma grade IIIA, in remission: Secondary | ICD-10-CM | POA: Diagnosis not present

## 2024-02-18 LAB — CBC WITH DIFFERENTIAL (CANCER CENTER ONLY)
Abs Immature Granulocytes: 0.01 10*3/uL (ref 0.00–0.07)
Basophils Absolute: 0.1 10*3/uL (ref 0.0–0.1)
Basophils Relative: 1 %
Eosinophils Absolute: 0.2 10*3/uL (ref 0.0–0.5)
Eosinophils Relative: 4 %
HCT: 37.9 % (ref 36.0–46.0)
Hemoglobin: 12.9 g/dL (ref 12.0–15.0)
Immature Granulocytes: 0 %
Lymphocytes Relative: 20 %
Lymphs Abs: 1.1 10*3/uL (ref 0.7–4.0)
MCH: 31.6 pg (ref 26.0–34.0)
MCHC: 34 g/dL (ref 30.0–36.0)
MCV: 92.9 fL (ref 80.0–100.0)
Monocytes Absolute: 0.4 10*3/uL (ref 0.1–1.0)
Monocytes Relative: 8 %
Neutro Abs: 3.7 10*3/uL (ref 1.7–7.7)
Neutrophils Relative %: 67 %
Platelet Count: 235 10*3/uL (ref 150–400)
RBC: 4.08 MIL/uL (ref 3.87–5.11)
RDW: 13 % (ref 11.5–15.5)
WBC Count: 5.6 10*3/uL (ref 4.0–10.5)
nRBC: 0 % (ref 0.0–0.2)

## 2024-02-18 LAB — CMP (CANCER CENTER ONLY)
ALT: 28 U/L (ref 0–44)
AST: 30 U/L (ref 15–41)
Albumin: 4.3 g/dL (ref 3.5–5.0)
Alkaline Phosphatase: 82 U/L (ref 38–126)
Anion gap: 6 (ref 5–15)
BUN: 14 mg/dL (ref 8–23)
CO2: 30 mmol/L (ref 22–32)
Calcium: 9.4 mg/dL (ref 8.9–10.3)
Chloride: 105 mmol/L (ref 98–111)
Creatinine: 0.83 mg/dL (ref 0.44–1.00)
GFR, Estimated: >60 mL/min (ref >60)
Glucose, Bld: 108 mg/dL — ABNORMAL HIGH (ref 70–99)
Potassium: 4.9 mmol/L (ref 3.5–5.1)
Sodium: 141 mmol/L (ref 135–145)
Total Bilirubin: 0.4 mg/dL (ref 0.0–1.2)
Total Protein: 6.7 g/dL (ref 6.5–8.1)

## 2024-02-18 NOTE — Progress Notes (Unsigned)
 [facility] [phone]   [phone]  PROGRESS NOTE  Patient Care Team: Katrinka Garnette KIDD, MD as PCP - General (Family Medicine) Cloretta Arley NOVAK, MD as Consulting Physician (Hematology and Oncology)  Hematological/Oncological History # Follicular Lymphoma Stage III, Grade 3a  12/26/2023: right cervical lymph node biopsy showed follicular lymphoma with focal centroblasts approaching 15/HPF suggestive of grade 3A, i.e. high-grade, follicular lymphoma  01/16/2024: establish care with Dr. Federico  01/25/2024: NM PET CT scan showed evidence of active lymphoma within the neck, chest, abdomen, and pelvis. ([place]) 5, as well as mild splenic hypermetabolism   Interval History:  Tracy Bradley 68 y.o. female with medical history significant for follicular lymphoma, grade 3A, stage III who presents for a follow up visit. The patient's last visit was on 02/01/2024. In the interim since the last visit she***  On exam today [name] ***  MEDICAL HISTORY:  Past Medical History:  Diagnosis Date   Anemia    since childhood   Ileus following gastrointestinal surgery (HCC) 04/22/2012   Muscle strain 04/22/2012    SURGICAL HISTORY: Past Surgical History:  Procedure Laterality Date   BACK SURGERY     1980- low back ruptured disk   LAPAROSCOPIC APPENDECTOMY  04/14/2012   Procedure: APPENDECTOMY LAPAROSCOPIC;  Surgeon: Alm VEAR Angle, MD;  Location: [facility];  Service: General;  Laterality: N/A;   LYMPH NODE BIOPSY Right 02/06/2024   Procedure: RIGHT NECK CERVICAL LYMPH NODE BIOPSY;  Surgeon: Tobie Eldora NOVAK, MD;  Location: [facility] OR;  Service: ENT;  Laterality: Right;   TONSILLECTOMY     1980s    SOCIAL HISTORY: Social History   Socioeconomic History   Marital status: Married    Spouse name: Not on file   Number of children: Not on file   Years of education: Not on file   Highest education level: Not on file  Occupational History   Not on file  Tobacco  Use   Smoking status: Never   Smokeless tobacco: Never  Vaping Use   Vaping status: Never Used  Substance and Sexual Activity   Alcohol use: No   Drug use: No   Sexual activity: Yes    Birth control/protection: Post-menopausal    Comment: husband only  Other Topics Concern   Not on file  Social History Narrative   Married. 3 children- 40, 38, 25 in 2025- 2 sons and a daughter. No grandkids yet.       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 drug abuse (children had gone there)   Social Drivers of Corporate investment banker Strain: Not on file  Food Insecurity: No Food Insecurity (01/16/2024)   Hunger Vital Sign    Worried About Running Out of Food in the Last Year: Never true    Ran Out of Food in the Last Year: Never true  Transportation Needs: No Transportation Needs (01/16/2024)   PRAPARE - Administrator, Civil Service (Medical): No    Lack of Transportation (Non-Medical): No  Physical Activity: Not on file  Stress: Not on file  Social Connections: Not on file  Intimate Partner Violence: Not At Risk (01/16/2024)   Humiliation, Afraid, Rape, and Kick questionnaire    Fear of Current or Ex-Partner: No    Emotionally Abused: No    Physically Abused: No    Sexually Abused: No    FAMILY HISTORY: Family History  Problem Relation Age of Onset   Stroke Mother    Lymphoma Father    Drug abuse Daughter    Depression Daughter    Bipolar disorder Daughter    Breast cancer Maternal Aunt 48 - 47   Breast cancer Paternal Aunt 13 - 54   Stroke Maternal Grandmother    Stroke Paternal Grandfather    Diabetes Brother    Hyperlipidemia Brother    Kidney disease Brother        dehydration and stopped dm meds etc- thankfully had recovery   Depression Brother    Drug abuse Brother    Drug abuse Son    Depression Son    BRCA 1/2 Neg Hx     ALLERGIES:  has no known  allergies.  MEDICATIONS:  Current Outpatient Medications  Medication Sig Dispense Refill   acetaminophen  (TYLENOL ) 500 MG tablet Take 2 tablets (1,000 mg total) by mouth every 6 (six) hours as needed. 30 tablet 0   ibuprofen  (ADVIL ) 200 MG tablet Take 2 tablets (400 mg total) by mouth every 6 (six) hours as needed. 30 tablet 0   Multiple Vitamins-Minerals (MULTIVITAMIN WITH MINERALS) tablet Take 1 tablet by mouth daily.     No current facility-administered medications for this visit.    REVIEW OF SYSTEMS:   Constitutional: ( - ) fevers, ( - )  chills , ( - ) night sweats Eyes: ( - ) blurriness of vision, ( - ) double vision, ( - ) watery eyes Ears, nose, mouth, throat, and face: ( - ) mucositis, ( - ) sore throat Respiratory: ( - ) cough, ( - ) dyspnea, ( - ) wheezes Cardiovascular: ( - ) palpitation, ( - ) chest discomfort, ( - ) lower extremity swelling Gastrointestinal:  ( - ) nausea, ( - ) heartburn, ( - ) change in bowel habits Skin: ( - ) abnormal skin rashes Lymphatics: ( - ) new lymphadenopathy, ( - ) easy bruising Neurological: ( - ) numbness, ( - ) tingling, ( - ) new weaknesses Behavioral/Psych: ( - ) mood change, ( - ) new changes  All other systems were reviewed with the patient and are negative.  PHYSICAL EXAMINATION: ECOG PERFORMANCE STATUS: 1 - Symptomatic but completely ambulatory  There were no vitals filed for this visit.  There were no vitals filed for this visit.   GENERAL: alert, no distress and comfortable SKIN: skin color, texture, turgor are normal, no rashes or significant lesions EYES: conjunctiva are pink and non-injected, sclera clear OROPHARYNX: no exudate, no erythema; lips, buccal mucosa, and tongue normal  NECK: supple, non-tender LYMPH:  no palpable lymphadenopathy in the cervical, axillary or inguinal LUNGS: clear to auscultation and percussion with normal breathing effort HEART: regular rate & rhythm and no murmurs and no lower extremity  edema ABDOMEN: soft, non-tender, non-distended, normal bowel sounds Musculoskeletal: no cyanosis of digits and no clubbing  PSYCH: alert & oriented x 3, fluent speech NEURO: no focal motor/sensory deficits  LABORATORY DATA:  I have reviewed the data as listed    Latest Ref Rng & Units 02/01/2024    9:54 AM 01/16/2024    2:29 PM 12/06/2023   11:06 AM  CBC  WBC 4.0 - 10.5 K/uL 6.3  5.9  5.6   Hemoglobin 12.0 - 15.0 g/dL 87.1  87.3  86.6   Hematocrit 36.0 - 46.0 % 37.7  36.9  40.0   Platelets 150 - 400 K/uL 206  223  255.0  Latest Ref Rng & Units 02/01/2024    9:54 AM 01/16/2024    2:29 PM 12/06/2023   11:06 AM  CMP  Glucose 70 - 99 mg/dL 890  886  98   BUN 8 - 23 mg/dL 16  13  17    Creatinine 0.44 - 1.00 mg/dL 9.28  9.16  9.26   Sodium 135 - 145 mmol/L 141  142  139   Potassium 3.5 - 5.1 mmol/L 4.4  4.3  4.0   Chloride 98 - 111 mmol/L 108  106  103   CO2 22 - 32 mmol/L 29  30  25    Calcium 8.9 - 10.3 mg/dL 8.8  9.3  9.6   Total Protein 6.5 - 8.1 g/dL 6.6  6.8  7.2   Total Bilirubin 0.0 - 1.2 mg/dL 0.3  0.3  0.4   Alkaline Phos 38 - 126 U/L 84  60  73   AST 15 - 41 U/L 50  28  31   ALT 0 - 44 U/L 46  24  28     RADIOGRAPHIC STUDIES: I have personally reviewed the radiological images as listed and agreed with the findings in the report. NM PET Image Initial (PI) Skull Base To Thigh (F-18 FDG) Result Date: 01/31/2024 CLINICAL DATA:  Initial treatment strategy for grade 3A follicular lymphoma of cervical nodes. EXAM: NUCLEAR MEDICINE PET SKULL BASE TO THIGH TECHNIQUE: 9.8 mCi F-18 FDG was injected intravenously. Full-ring PET imaging was performed from the skull base to thigh after the radiotracer. CT data was obtained and used for attenuation correction and anatomic localization. Fasting blood glucose: 111 mg/dl COMPARISON:  Neck CT 95/77/7974.  Abdominopelvic CT 04/14/2012. FINDINGS: Mediastinal blood pool activity: SUV max 2.7 Liver activity: SUV max 4.0 NECK: Bilateral  hypermetabolic cervical nodes. Right-sided level 2 nodal mass measures 4.0 x 3.1 cm and a S.U.V. max of 10.6 on 23/4. Incidental CT findings: Deferred to recent diagnostic CT. CHEST: No pulmonary parenchymal hypermetabolism. Mediastinal and hilar nodal hypermetabolism. Example subcarinal node at 2.2 cm and a S.U.V. max of 7.2 on 61/4. Incidental CT findings: Mild cardiomegaly. Aortic atherosclerosis. Ascending aorta measures 4.0 cm. ABDOMEN/PELVIS: Mild splenic hypermetabolism at a S.U.V. max of 6.1. No splenomegaly. Extensive abdominopelvic nodal hypermetabolism. Example left periaortic nodal conglomerate at 1.9 cm and a S.U.V. max of 8.6 on 118/4. Nodes posterior to the cecum measure up to 12 mm and a S.U.V. max of 8.3 on 134/4. Index left external iliac node measures 2.5 cm and a S.U.V. max of 10.5 on 161/4. Incidental CT findings: Normal adrenal glands. Extensive colonic diverticulosis. Pelvic floor laxity. Pessary. Trace free pelvic fluid. SKELETON: No abnormal marrow activity. Incidental CT findings: None. IMPRESSION: 1. Evidence of active lymphoma within the neck, chest, abdomen, and pelvis. (Deauville) 5 2. Mild splenic hypermetabolism, suspicious for lymphomatous involvement. 3. Ascending aorta of 4.0 cm. This could either be re-evaluated on follow-up routine CT/PET or characterized with follow-up CTA in 1 year. 4. Incidental findings, including: Aortic Atherosclerosis (ICD10-I70.0). Trace free pelvic fluid. Electronically Signed   By: Rockey Kilts M.D.   On: 01/31/2024 18:30    ASSESSMENT & PLAN Tracy Bradley 68 y.o. female with medical history significant for follicular lymphoma, grade 3A, stage III who presents for a follow up visit.  After review of the labs, review of the records, and discussion with the patient the patients findings are most consistent with high-grade follicular lymphoma, staging in process.   Previously we discussed the nature of follicular  lymphoma and steps moving  forward.  We discussed that it tends to be a more indolent lymphoma and the treatment may not be required immediately.  We discussed criteria for treatment including bulky disease as well as rapid progression.  Additionally we discussed that her blood counts appear stable at last check, we will recheck these today.  Pending results that scan we will will determine next best steps moving forward.  The patient voiced understanding of our findings, the nature of the disease, and the plan moving forward.    # Follicular Lymphoma grade 3a, Stage III -- Needle core biopsy confirmed follicular lymphoma, grade 3A (high-grade). excisional biopsy confirmed no transformation to more aggressive lymphoma.  -- PET CT scan confirms stage III disease with involvement on both sides of the diaphragm.  No evidence of organ invasion or bone marrow involvement --port placement scheduled for 02/21/2024.  --gave the patient options for monotherapy rituximab versus Bendamustine and rituximab.  Both would be effective treatments.  Given her young age I do believe the combination of chemotherapy and immunotherapy would be the best option, though immunotherapy alone would be reasonable.  Presented these options to the patient today. PLAN: -- will proceed with ***  --labs today show WBC 5.6, Hgb 12.9, MCV 92.9, Plt 235  -- Plan for return to clinic to start treatment on ***   #Supportive Care -- chemotherapy education to be scheduled  -- port placement on 02/21/2024.  -- zofran  8mg  q8H PRN and compazine 10mg  PO q6H for nausea -- allopurinol 300mg  PO daily for TLS prophylaxis -- EMLA cream for port -- no pain medication required at this time.    No orders of the defined types were placed in this encounter.   All questions were answered. The patient knows to call the clinic with any problems, questions or concerns.  A total of more than 30 minutes were spent on this encounter with face-to-face time and non-face-to-face  time, including preparing to see the patient, ordering tests and/or medications, counseling the patient and coordination of care as outlined above.   Norleen IVAR Kidney, MD Department of Hematology/Oncology [facility] Phone: [phone] Pager: [phone] Email: [email or web address]  02/18/2024 7:37 AM

## 2024-02-19 ENCOUNTER — Ambulatory Visit (INDEPENDENT_AMBULATORY_CARE_PROVIDER_SITE_OTHER): Admitting: Physician Assistant

## 2024-02-19 ENCOUNTER — Encounter (INDEPENDENT_AMBULATORY_CARE_PROVIDER_SITE_OTHER): Payer: Self-pay | Admitting: Physician Assistant

## 2024-02-19 ENCOUNTER — Encounter: Payer: Self-pay | Admitting: Hematology and Oncology

## 2024-02-19 VITALS — BP 139/79 | HR 75

## 2024-02-19 DIAGNOSIS — C8231 Follicular lymphoma grade IIIa, lymph nodes of head, face, and neck: Secondary | ICD-10-CM | POA: Diagnosis not present

## 2024-02-19 MED ORDER — PROCHLORPERAZINE MALEATE 10 MG PO TABS: 10.0000 mg | ORAL_TABLET | Freq: Four times a day (QID) | ORAL | 0 refills | Status: DC | PRN

## 2024-02-19 MED ORDER — LIDOCAINE-PRILOCAINE 2.5-2.5 % EX CREA
1.0000 | TOPICAL_CREAM | CUTANEOUS | 0 refills | Status: DC | PRN
Start: 2024-02-19 — End: 2024-05-21

## 2024-02-19 MED ORDER — ALLOPURINOL 300 MG PO TABS: 300.0000 mg | ORAL_TABLET | Freq: Every day | ORAL | 1 refills | Status: DC

## 2024-02-19 MED ORDER — ONDANSETRON HCL 8 MG PO TABS: 8.0000 mg | ORAL_TABLET | Freq: Three times a day (TID) | ORAL | 0 refills | Status: AC | PRN

## 2024-02-19 NOTE — Progress Notes (Signed)
 Dear Dr. Katrinka, Here is my assessment for our mutual patient, Tracy Bradley. Thank you for allowing me the opportunity to care for your patient. Please do not hesitate to contact me should you have any other questions. Sincerely, Chyrl Cohen PA-C  Otolaryngology Clinic Note Referring provider: Dr. Katrinka HPI:  Tracy Bradley is a 68 y.o. female kindly referred by Dr. Katrinka   The patient is a 68 year old female seen in our office for postop follow-up status post right neck cervical lymph node biopsy by Dr. Tobie on 02/06/2024.  Biopsy results show follicular lymphoma consistent with lymphoma stage III, grade 3A.  The patient notes she has done very well since surgery.  She denies any pain, swelling, redness, fever, drainage, difficulty swallowing.  She has followed up with hematology oncology and will be proceeding with medical management.  No concerns with her incision site today.   Independent Review of Additional Tests or Records:  Hematology oncology note 02/18/2024   PMH/Meds/All/SocHx/FamHx/ROS:   Past Medical History:  Diagnosis Date   Anemia    since childhood   Ileus following gastrointestinal surgery (HCC) 04/22/2012   Muscle strain 04/22/2012     Past Surgical History:  Procedure Laterality Date   BACK SURGERY     1980- low back ruptured disk   LAPAROSCOPIC APPENDECTOMY  04/14/2012   Procedure: APPENDECTOMY LAPAROSCOPIC;  Surgeon: Alm VEAR Angle, MD;  Location: [facility];  Service: General;  Laterality: N/A;   LYMPH NODE BIOPSY Right 02/06/2024   Procedure: RIGHT NECK CERVICAL LYMPH NODE BIOPSY;  Surgeon: Tobie Eldora NOVAK, MD;  Location: [facility] OR;  Service: ENT;  Laterality: Right;   TONSILLECTOMY     1980s    Family History  Problem Relation Age of Onset   Stroke Mother    Lymphoma Father    Drug abuse Daughter    Depression Daughter    Bipolar disorder Daughter    Breast cancer Maternal Aunt 67 - 49   Breast cancer Paternal Aunt 50 - 59   Stroke Maternal Grandmother     Stroke Paternal Grandfather    Diabetes Brother    Hyperlipidemia Brother    Kidney disease Brother        dehydration and stopped dm meds etc- thankfully had recovery   Depression Brother    Drug abuse Brother    Drug abuse Son    Depression Son    BRCA 1/2 Neg Hx      Social Connections: Not on file      Current Outpatient Medications:    acetaminophen  (TYLENOL ) 500 MG tablet, Take 2 tablets (1,000 mg total) by mouth every 6 (six) hours as needed., Disp: 30 tablet, Rfl: 0   ibuprofen  (ADVIL ) 200 MG tablet, Take 2 tablets (400 mg total) by mouth every 6 (six) hours as needed., Disp: 30 tablet, Rfl: 0   Multiple Vitamins-Minerals (MULTIVITAMIN WITH MINERALS) tablet, Take 1 tablet by mouth daily., Disp: , Rfl:    Physical Exam:   There were no vitals taken for this visit.  Pertinent Findings  CN II-XII intact Right sided cervical incision clean dry and intact with no surrounding redness warmth or swelling, bulky lymph nodes noted throughout the right submandibular and cervical region No respiratory distress or stridor  Seprately Identifiable Procedures:  None  Impression & Plans:  Tracy Bradley is a 68 y.o. female with the following   Postop status post cervical lymph node biopsy-  The patient is doing very well today with no complaints or concerns.  Her incision is clean dry and intact.  At this point she will continue following up with hematology oncology, we are happy to see her back in the office with any future questions or concerns.  Return precautions given.   - f/u PRN   Thank you for allowing me the opportunity to care for your patient. Please do not hesitate to contact me should you have any other questions.  Sincerely, Chyrl Cohen PA-C [facility] Phone: [phone] Fax: [phone]  02/19/2024, 8:42 AM

## 2024-02-19 NOTE — Progress Notes (Signed)
START ON PATHWAY REGIMEN - Lymphoma and CLL     A cycle is every 28 days:     Rituximab-xxxx      Bendamustine   **Always confirm dose/schedule in your pharmacy ordering system**  Patient Characteristics: Follicular Lymphoma, Grades 1, 2, and 3A, First Line, Stage III / IV, Symptomatic or Bulky Disease Disease Type: Follicular Lymphoma, Grade 1, 2, or 3A Disease Type: Not Applicable Disease Type: Not Applicable Line of Therapy: First Line Disease Characteristics: Symptomatic or Bulky Disease Intent of Therapy: Curative Intent, Discussed with Patient 

## 2024-02-20 ENCOUNTER — Other Ambulatory Visit: Payer: Self-pay | Admitting: Radiology

## 2024-02-20 ENCOUNTER — Other Ambulatory Visit: Payer: Self-pay

## 2024-02-20 ENCOUNTER — Telehealth: Payer: Self-pay | Admitting: Hematology and Oncology

## 2024-02-20 ENCOUNTER — Encounter: Payer: Self-pay | Admitting: Hematology and Oncology

## 2024-02-20 ENCOUNTER — Inpatient Hospital Stay

## 2024-02-20 NOTE — Telephone Encounter (Signed)
 Scheduled appointments per WQ. Talked with the patient and she is aware of all made appointments.

## 2024-02-20 NOTE — H&P (Signed)
 Chief Complaint: Lymphoma - IR consulted for port-a-catheter placement  Referring Provider(s): Dr. Norleen Kidney  Supervising Physician: Hughes Simmonds  Patient Status: [facility] - Out-pt  History of Present Illness: Tracy Bradley is a 68 y.o. female with hx of follicular lymphoma. First found to have neck nodule April 2025. Was seen by IR 12/26/23 for biopsy of right neck mass that confirmed diagnosis of follicular lymphoma. Has since established with Dr. Kidney with oncology. She had an excisional biopsy of right neck mass with ENT 02/06/24. Now is requiring chemotherapy and presenting back to IR service for port-a-catheter placement.  *** Patient is Full Code  Past Medical History:  Diagnosis Date   Anemia    since childhood   Ileus following gastrointestinal surgery (HCC) 04/22/2012   Muscle strain 04/22/2012    Past Surgical History:  Procedure Laterality Date   BACK SURGERY     1980- low back ruptured disk   LAPAROSCOPIC APPENDECTOMY  04/14/2012   Procedure: APPENDECTOMY LAPAROSCOPIC;  Surgeon: Alm VEAR Angle, MD;  Location: [facility] ORS;  Service: General;  Laterality: N/A;   LYMPH NODE BIOPSY Right 02/06/2024   Procedure: RIGHT NECK CERVICAL LYMPH NODE BIOPSY;  Surgeon: Tobie Eldora NOVAK, MD;  Location: [facility] OR;  Service: ENT;  Laterality: Right;   TONSILLECTOMY     1980s    Allergies: Patient has no known allergies.  Medications: Prior to Admission medications   Medication Sig Start Date End Date Taking? Authorizing Provider  acetaminophen  (TYLENOL ) 500 MG tablet Take 2 tablets (1,000 mg total) by mouth every 6 (six) hours as needed. 02/06/24   Patel, Kunjan B, MD  allopurinol (ZYLOPRIM) 300 MG tablet Take 1 tablet (300 mg total) by mouth daily. 02/19/24   Kidney Norleen ONEIDA MADISON, MD  ibuprofen  (ADVIL ) 200 MG tablet Take 2 tablets (400 mg total) by mouth every 6 (six) hours as needed. 02/06/24   Patel, Kunjan B, MD  lidocaine -prilocaine (EMLA) cream Apply 1 Application topically as  needed. 02/19/24   Dorsey, John T IV, MD  Multiple Vitamins-Minerals (MULTIVITAMIN WITH MINERALS) tablet Take 1 tablet by mouth daily.    [provider]  ondansetron  (ZOFRAN ) 8 MG tablet Take 1 tablet (8 mg total) by mouth every 8 (eight) hours as needed. 02/19/24   Kidney Norleen ONEIDA MADISON, MD  prochlorperazine (COMPAZINE) 10 MG tablet Take 1 tablet (10 mg total) by mouth every 6 (six) hours as needed for nausea or vomiting. 02/19/24   Kidney Norleen ONEIDA MADISON, MD     Family History  Problem Relation Age of Onset   Stroke Mother    Lymphoma Father    Drug abuse Daughter    Depression Daughter    Bipolar disorder Daughter    Breast cancer Maternal Aunt 84 - 49   Breast cancer Paternal Aunt 77 - 59   Stroke Maternal Grandmother    Stroke Paternal Grandfather    Diabetes Brother    Hyperlipidemia Brother    Kidney disease Brother        dehydration and stopped dm meds etc- thankfully had recovery   Depression Brother    Drug abuse Brother    Drug abuse Son    Depression Son    BRCA 1/2 Neg Hx     Social History   Socioeconomic History   Marital status: Married    Spouse name: Not on file   Number of children: Not on file   Years of education: Not on file   Highest education  level: Not on file  Occupational History   Not on file  Tobacco Use   Smoking status: Never   Smokeless tobacco: Never  Vaping Use   Vaping status: Never Used  Substance and Sexual Activity   Alcohol use: No   Drug use: No   Sexual activity: Yes    Birth control/protection: Post-menopausal    Comment: husband only  Other Topics Concern   Not on file  Social History Narrative   Married. 3 children- 40, 38, 25 in 2025- 2 sons and a daughter. No grandkids yet.       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 drug abuse (children had gone there)   Social Drivers of Corporate investment banker Strain: Not on file  Food  Insecurity: No Food Insecurity (01/16/2024)   Hunger Vital Sign    Worried About Running Out of Food in the Last Year: Never true    Ran Out of Food in the Last Year: Never true  Transportation Needs: No Transportation Needs (01/16/2024)   PRAPARE - Administrator, Civil Service (Medical): No    Lack of Transportation (Non-Medical): No  Physical Activity: Not on file  Stress: Not on file  Social Connections: Not on file     Review of Systems: A 12 point ROS discussed and pertinent positives are indicated in the HPI above.  All other systems are negative.  Review of Systems  Vital Signs: There were no vitals taken for this visit.  Advance Care Plan: The advanced care place/surrogate decision maker was discussed at the time of visit and the patient did not wish to discuss or was not able to name a surrogate decision maker or provide an advance care plan.  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 EXAM: NUCLEAR MEDICINE PET SKULL BASE TO THIGH TECHNIQUE: 9.8 mCi F-18 FDG was injected intravenously. Full-ring PET imaging was performed from the skull base to thigh after the radiotracer. CT data was obtained and used for attenuation correction and anatomic localization. Fasting blood glucose: 111 mg/dl COMPARISON:  Neck CT 95/77/7974.  Abdominopelvic CT 04/14/2012. FINDINGS: Mediastinal blood pool activity: SUV max 2.7 Liver activity: SUV max 4.0 NECK: Bilateral hypermetabolic cervical nodes. Right-sided level 2 nodal mass measures 4.0 x 3.1 cm and a S.U.V. max of 10.6 on 23/4. Incidental CT findings: Deferred to recent diagnostic CT. CHEST: No pulmonary parenchymal hypermetabolism. Mediastinal and hilar nodal hypermetabolism. Example subcarinal node at 2.2 cm and a S.U.V. max of 7.2 on 61/4. Incidental CT findings: Mild  cardiomegaly. Aortic atherosclerosis. Ascending aorta measures 4.0 cm. ABDOMEN/PELVIS: Mild splenic hypermetabolism at a S.U.V. max of 6.1. No splenomegaly. Extensive abdominopelvic nodal hypermetabolism. Example left periaortic nodal conglomerate at 1.9 cm and a S.U.V. max of 8.6 on 118/4. Nodes posterior to the cecum measure up to 12 mm and a S.U.V. max of 8.3 on 134/4. Index left external iliac node measures 2.5 cm and a S.U.V. max of 10.5 on 161/4. Incidental CT findings: Normal adrenal glands. Extensive colonic diverticulosis. Pelvic floor laxity. Pessary. Trace free pelvic fluid. SKELETON: No abnormal marrow activity. Incidental CT findings: None. IMPRESSION: 1. Evidence of active lymphoma within the neck, chest, abdomen, and pelvis. ([place]) 5 2. Mild splenic hypermetabolism, suspicious for lymphomatous involvement. 3. Ascending aorta  of 4.0 cm. This could either be re-evaluated on follow-up routine CT/PET or characterized with follow-up CTA in 1 year. 4. Incidental findings, including: Aortic Atherosclerosis (ICD10-I70.0). Trace free pelvic fluid. Electronically Signed   By: Rockey Kilts M.D.   On: 01/31/2024 18:30    Labs:  CBC: Recent Labs    12/06/23 1106 01/16/24 1429 02/01/24 0954 02/18/24 1029  WBC 5.6 5.9 6.3 5.6  HGB 13.3 12.6 12.8 12.9  HCT 40.0 36.9 37.7 37.9  PLT 255.0 223 206 235    COAGS: No results for input(s): INR, APTT in the last 8760 hours.  BMP: Recent Labs    12/06/23 1106 01/16/24 1429 02/01/24 0954 02/18/24 1029  NA 139 142 141 141  K 4.0 4.3 4.4 4.9  CL 103 106 108 105  CO2 25 30 29 30   GLUCOSE 98 113* 109* 108*  BUN 17 13 16 14   CALCIUM 9.6 9.3 8.8* 9.4  CREATININE 0.73 0.83 0.71 0.83  GFRNONAA  --  >60 >60 >60    LIVER FUNCTION TESTS: Recent Labs    12/06/23 1106 01/16/24 1429 02/01/24 0954 02/18/24 1029  BILITOT 0.4 0.3 0.3 0.4  AST 31 28 50* 30  ALT 28 24 46* 28  ALKPHOS 73 60 84 82  PROT 7.2 6.8 6.6 6.7  ALBUMIN 4.8 4.5  4.2 4.3    TUMOR MARKERS: No results for input(s): AFPTM, CEA, CA199, CHROMGRNA in the last 8760 hours.  Assessment and Plan:  Aleatha Taite is a 68 y.o. female with hx of follicular lymphoma. First found to have neck nodule April 2025. Was seen by IR 12/26/23 for biopsy of right neck mass that confirmed diagnosis of follicular lymphoma. Has since established with Dr. Federico with oncology. She had an excisional biopsy of right neck mass with ENT 02/06/24. Now is requiring chemotherapy and presenting back to IR service for port-a-catheter placement.  Risks and benefits of image-guided Port-a-catheter placement were discussed with the patient including, but not limited to bleeding, infection, pneumothorax, or fibrin sheath development and need for additional procedures. All of the patient's questions were answered, patient is agreeable to proceed. Consent signed and in chart.   Thank you for allowing our service to participate in Vanity Larsson 's care.  Electronically Signed: Kimble VEAR Clas, PA-C   02/20/2024, 11:40 AM      I spent a total of {New PWEU:[id number]} {New Out-Pt:[id number]}  {Established Out-Pt:[id number]} in face to face in clinical consultation, greater than 50% of which was counseling/coordinating care for port-a-catheter placement.

## 2024-02-21 ENCOUNTER — Ambulatory Visit (HOSPITAL_COMMUNITY)
Admission: RE | Admit: 2024-02-21 | Discharge: 2024-02-21 | Disposition: A | Discharge status: Home / Self Care | Source: Ambulatory Visit | Attending: Hematology and Oncology | Admitting: Hematology and Oncology

## 2024-02-21 ENCOUNTER — Encounter (HOSPITAL_COMMUNITY): Payer: Self-pay

## 2024-02-21 ENCOUNTER — Other Ambulatory Visit: Payer: Self-pay

## 2024-02-21 DIAGNOSIS — C8238 Follicular lymphoma grade IIIa, lymph nodes of multiple sites: Secondary | ICD-10-CM | POA: Insufficient documentation

## 2024-02-21 HISTORY — PX: IR IMAGING GUIDED PORT INSERTION: IMG5740

## 2024-02-21 MED ORDER — DIPHENHYDRAMINE HCL 50 MG/ML IJ SOLN
INTRAMUSCULAR | Status: AC | PRN
  Administered 2024-02-21: 25 mg via INTRAVENOUS

## 2024-02-21 MED ORDER — MIDAZOLAM HCL 2 MG/2ML IJ SOLN
INTRAMUSCULAR | Status: AC | PRN
  Administered 2024-02-21: .5 mg via INTRAVENOUS
  Administered 2024-02-21: 1 mg via INTRAVENOUS

## 2024-02-21 MED ORDER — LIDOCAINE-EPINEPHRINE 1 %-1:100000 IJ SOLN
20.0000 mL | Freq: Once | INTRAMUSCULAR | Status: AC
  Administered 2024-02-21: 20 mL via INTRADERMAL

## 2024-02-21 MED ORDER — SODIUM CHLORIDE 0.9 % IV SOLN: INTRAVENOUS | Status: DC

## 2024-02-21 MED ORDER — LIDOCAINE-EPINEPHRINE 1 %-1:100000 IJ SOLN
INTRAMUSCULAR | Status: AC
  Filled 2024-02-21: qty 1

## 2024-02-21 MED ORDER — HEPARIN SOD (PORK) LOCK FLUSH 100 UNIT/ML IV SOLN
INTRAVENOUS | Status: AC
  Filled 2024-02-21: qty 5

## 2024-02-21 MED ORDER — FENTANYL CITRATE (PF) 100 MCG/2ML IJ SOLN
INTRAMUSCULAR | Status: AC | PRN
  Administered 2024-02-21: 25 ug via INTRAVENOUS
  Administered 2024-02-21: 50 ug via INTRAVENOUS

## 2024-02-21 MED ORDER — HEPARIN SOD (PORK) LOCK FLUSH 100 UNIT/ML IV SOLN
500.0000 [IU] | Freq: Once | INTRAVENOUS | Status: AC
  Administered 2024-02-21: 500 [IU]

## 2024-02-21 MED ORDER — DIPHENHYDRAMINE HCL 50 MG/ML IJ SOLN
INTRAMUSCULAR | Status: AC
  Filled 2024-02-21: qty 1

## 2024-02-21 MED ORDER — FENTANYL CITRATE (PF) 100 MCG/2ML IJ SOLN
INTRAMUSCULAR | Status: AC
  Filled 2024-02-21: qty 2

## 2024-02-21 MED ORDER — MIDAZOLAM HCL 2 MG/2ML IJ SOLN
INTRAMUSCULAR | Status: AC
  Filled 2024-02-21: qty 2

## 2024-02-21 NOTE — Procedures (Signed)
 Vascular and Interventional Radiology Procedure Note  Patient: Tracy Bradley DOB: 01/20/1956 Medical Record Number: [id number] Note Date/Time: 02/21/24 11:30 AM   Performing Physician: Thom Hall, MD Assistant(s): None  Diagnosis: Lymphoma  Procedure: PORT PLACEMENT  Anesthesia: Conscious Sedation Complications: None Estimated Blood Loss: Minimal  Findings:  Successful right-sided port placement, with the tip of the catheter in the proximal right atrium.  Plan: Catheter ready for use.  See detailed procedure note with images in PACS. The patient tolerated the procedure well without incident or complication and was returned to Recovery in stable condition.    Thom Hall, MD [facility]   Pager. [phone] Clinic. [phone]

## 2024-02-21 NOTE — Discharge Instructions (Signed)
 Please call Interventional Radiology clinic [phone] with any questions or concerns.  You may remove your dressing and shower tomorrow.  After the procedure, it is common to have: Discomfort at the port insertion site. Bruising on the skin over the port. This should improve over 3-4 days  Follow these instructions at home:  Medication: Do not use Aspirin or ibuprofen products, such as Advil or Motrin, as it may increase bleeding.  You may resume your usual medications as ordered by your doctor. If your doctor prescribed antibiotics, take them as directed. Do not stop taking them just because you feel better. You need to take the full course of antibiotics.  Eating and drinking: Drink plenty of liquids to keep your urine pale yellow You can resume your regular diet as directed by your doctor   Care of the procedure site Follow instructions from your health care provider about how to take care of your port insertion site. Make sure you: After your port is placed, you will get a manufacturer's information card. The card has information about your port. Keep this card with you at all times Make sure to remember what type of port you have Take care of the port as told by your health care provider DO NOT use EMLA cream for 2 weeks after port placement -the cream will remove the surgical glue on your incision DO NOT use any lotions, creams, or ointments on incision for 2 weeks. This will remove the surgical glue on your incision Wash your hands with soap and water before and after you change your bandage (dressing). If soap and water are not available, use hand sanitizer Change your dressing as told by your health care provider Leave skin glue, or adhesive strips in place. These skin closures may need to stay in place for 2 weeks or longer Check your port insertion site every day for signs of infection. Check for: Redness, swelling, or pain Fluid or blood Warmth Pus or a bad  smell  Activity Return to your normal activities as told by your health care provider. Ask your health care provider what activities are safe for you Do not lift anything that is heavier than 10 lb (4.5 kg), or the limit that you are told, until your health care provider says that it is safe Do not take baths, swim, or use a hot tub until your health care provider approves. Take showers only. Keep all follow-up visits as told by your doctor  Contact a health care provider if: You cannot flush your port with saline as directed, or you cannot draw blood from the port You have a fever or chills You have redness, swelling, or pain around your port insertion site You have fluid or blood coming from your port insertion site Your port insertion site feels warm to the touch You have pus or a bad smell coming from the port insertion site  Get help right away if: You have chest pain or shortness of breath You have bleeding from your port that you cannot control

## 2024-02-22 ENCOUNTER — Inpatient Hospital Stay: Admitting: Physician Assistant

## 2024-02-22 ENCOUNTER — Inpatient Hospital Stay

## 2024-02-22 VITALS — BP 141/82 | HR 76 | Wt 199.9 lb

## 2024-02-22 DIAGNOSIS — Z95828 Presence of other vascular implants and grafts: Secondary | ICD-10-CM | POA: Insufficient documentation

## 2024-02-22 DIAGNOSIS — C8238 Follicular lymphoma grade IIIa, lymph nodes of multiple sites: Secondary | ICD-10-CM | POA: Diagnosis not present

## 2024-02-22 DIAGNOSIS — C823A Follicular lymphoma grade IIIA, in remission: Secondary | ICD-10-CM | POA: Diagnosis not present

## 2024-02-22 LAB — CMP (CANCER CENTER ONLY)
ALT: 21 U/L (ref 0–44)
AST: 26 U/L (ref 15–41)
Albumin: 4.1 g/dL (ref 3.5–5.0)
Alkaline Phosphatase: 77 U/L (ref 38–126)
Anion gap: 6 (ref 5–15)
BUN: 15 mg/dL (ref 8–23)
CO2: 28 mmol/L (ref 22–32)
Calcium: 9 mg/dL (ref 8.9–10.3)
Chloride: 104 mmol/L (ref 98–111)
Creatinine: 0.78 mg/dL (ref 0.44–1.00)
GFR, Estimated: >60 mL/min (ref >60)
Glucose, Bld: 102 mg/dL — ABNORMAL HIGH (ref 70–99)
Potassium: 4.1 mmol/L (ref 3.5–5.1)
Sodium: 138 mmol/L (ref 135–145)
Total Bilirubin: 0.4 mg/dL (ref 0.0–1.2)
Total Protein: 6.3 g/dL — ABNORMAL LOW (ref 6.5–8.1)

## 2024-02-22 LAB — CBC WITH DIFFERENTIAL (CANCER CENTER ONLY)
Abs Immature Granulocytes: 0.02 10*3/uL (ref 0.00–0.07)
Basophils Absolute: 0.1 10*3/uL (ref 0.0–0.1)
Basophils Relative: 1 %
Eosinophils Absolute: 0.2 10*3/uL (ref 0.0–0.5)
Eosinophils Relative: 3 %
HCT: 35.5 % — ABNORMAL LOW (ref 36.0–46.0)
Hemoglobin: 11.9 g/dL — ABNORMAL LOW (ref 12.0–15.0)
Immature Granulocytes: 0 %
Lymphocytes Relative: 21 %
Lymphs Abs: 1.2 10*3/uL (ref 0.7–4.0)
MCH: 30.7 pg (ref 26.0–34.0)
MCHC: 33.5 g/dL (ref 30.0–36.0)
MCV: 91.5 fL (ref 80.0–100.0)
Monocytes Absolute: 0.4 10*3/uL (ref 0.1–1.0)
Monocytes Relative: 8 %
Neutro Abs: 3.8 10*3/uL (ref 1.7–7.7)
Neutrophils Relative %: 67 %
Platelet Count: 211 10*3/uL (ref 150–400)
RBC: 3.88 MIL/uL (ref 3.87–5.11)
RDW: 12.9 % (ref 11.5–15.5)
WBC Count: 5.6 10*3/uL (ref 4.0–10.5)
nRBC: 0 % (ref 0.0–0.2)

## 2024-02-22 MED ORDER — SODIUM CHLORIDE 0.9% FLUSH
10.0000 mL | Freq: Once | INTRAVENOUS | Status: AC
  Administered 2024-02-22: 10 mL

## 2024-02-22 MED ORDER — HEPARIN SOD (PORK) LOCK FLUSH 100 UNIT/ML IV SOLN
500.0000 [IU] | Freq: Once | INTRAVENOUS | Status: AC
  Administered 2024-02-22: 500 [IU]

## 2024-02-22 NOTE — Progress Notes (Signed)
 [facility] [phone]   [phone]  PROGRESS NOTE  Patient Care Team: Katrinka Garnette KIDD, MD as PCP - General (Family Medicine) Cloretta Arley NOVAK, MD as Consulting Physician (Hematology and Oncology)  Hematological/Oncological History # Follicular Lymphoma Stage III, Grade 3a  12/26/2023: right cervical lymph node biopsy showed follicular lymphoma with focal centroblasts approaching 15/HPF suggestive of grade 3A, i.e. high-grade, follicular lymphoma  01/16/2024: establish care with Dr. Federico  01/25/2024: NM PET CT scan showed evidence of active lymphoma within the neck, chest, abdomen, and pelvis. ([place]) 5, as well as mild splenic hypermetabolism   Interval History:  Tracy Bradley 68 y.o. female with medical history significant for follicular lymphoma, grade 3A, stage III who presents for a follow up visit.   On exam today Ms. Basinski is accompanied by her husband. She is doing well and has no new issues. Her energy and appetite are stable. She continues to have fullness in her right neck after undergoing excisional biopsy. She denies any interference with PO intake. She denies nausea, vomiting or bowel habit changes. She denies easy bruising or signs of active bleeding. She denies fevers, chills, sweats, shortness of breath, chest pain or cough. She has no other complaints. Rest of the ROS is below.  MEDICAL HISTORY:  Past Medical History:  Diagnosis Date   Anemia    since childhood   Ileus following gastrointestinal surgery (HCC) 04/22/2012   Muscle strain 04/22/2012    SURGICAL HISTORY: Past Surgical History:  Procedure Laterality Date   BACK SURGERY     1980- low back ruptured disk   IR IMAGING GUIDED PORT INSERTION  02/21/2024   LAPAROSCOPIC APPENDECTOMY  04/14/2012   Procedure: APPENDECTOMY LAPAROSCOPIC;  Surgeon: Alm VEAR Angle, MD;  Location: [facility];  Service: General;  Laterality: N/A;   LYMPH NODE BIOPSY Right 02/06/2024    Procedure: RIGHT NECK CERVICAL LYMPH NODE BIOPSY;  Surgeon: Tobie Eldora NOVAK, MD;  Location: [facility] OR;  Service: ENT;  Laterality: Right;   TONSILLECTOMY     1980s    SOCIAL HISTORY: Social History   Socioeconomic History   Marital status: Married    Spouse name: Not on file   Number of children: Not on file   Years of education: Not on file   Highest education level: Not on file  Occupational History   Not on file  Tobacco Use   Smoking status: Never   Smokeless tobacco: Never  Vaping Use   Vaping status: Never Used  Substance and Sexual Activity   Alcohol use: No   Drug use: No   Sexual activity: Yes    Birth control/protection: Post-menopausal    Comment: husband only  Other Topics Concern   Not on file  Social History Narrative   Married. 3 children- 40, 38, 25 in 2025- 2 sons and a daughter. No grandkids yet.       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 drug abuse (children had gone there)   Social Drivers of Corporate investment banker Strain: Not on file  Food Insecurity: No Food Insecurity (01/16/2024)   Hunger Vital Sign    Worried About Running Out of Food in the Last Year: Never true    Ran Out of Food in the Last Year: Never true  Transportation Needs: No Transportation Needs (01/16/2024)   PRAPARE - Administrator, Civil Service (Medical): No  Lack of Transportation (Non-Medical): No  Physical Activity: Not on file  Stress: Not on file  Social Connections: Not on file  Intimate Partner Violence: Not At Risk (01/16/2024)   Humiliation, Afraid, Rape, and Kick questionnaire    Fear of Current or Ex-Partner: No    Emotionally Abused: No    Physically Abused: No    Sexually Abused: No    FAMILY HISTORY: Family History  Problem Relation Age of Onset   Stroke Mother    Lymphoma Father    Drug abuse Daughter    Depression Daughter    Bipolar disorder Daughter     Breast cancer Maternal Aunt 22 - 1   Breast cancer Paternal Aunt 35 - 68   Stroke Maternal Grandmother    Stroke Paternal Grandfather    Diabetes Brother    Hyperlipidemia Brother    Kidney disease Brother        dehydration and stopped dm meds etc- thankfully had recovery   Depression Brother    Drug abuse Brother    Drug abuse Son    Depression Son    BRCA 1/2 Neg Hx     ALLERGIES:  has no known allergies.  MEDICATIONS:  Current Outpatient Medications  Medication Sig Dispense Refill   acetaminophen  (TYLENOL ) 500 MG tablet Take 2 tablets (1,000 mg total) by mouth every 6 (six) hours as needed. 30 tablet 0   allopurinol  (ZYLOPRIM ) 300 MG tablet Take 1 tablet (300 mg total) by mouth daily. 90 tablet 1   ibuprofen  (ADVIL ) 200 MG tablet Take 2 tablets (400 mg total) by mouth every 6 (six) hours as needed. 30 tablet 0   lidocaine -prilocaine  (EMLA ) cream Apply 1 Application topically as needed. 30 g 0   Multiple Vitamins-Minerals (MULTIVITAMIN WITH MINERALS) tablet Take 1 tablet by mouth daily.     ondansetron  (ZOFRAN ) 8 MG tablet Take 1 tablet (8 mg total) by mouth every 8 (eight) hours as needed. 30 tablet 0   prochlorperazine  (COMPAZINE ) 10 MG tablet Take 1 tablet (10 mg total) by mouth every 6 (six) hours as needed for nausea or vomiting. 30 tablet 0   No current facility-administered medications for this visit.    REVIEW OF SYSTEMS:   Constitutional: ( - ) fevers, ( - )  chills , ( - ) night sweats Eyes: ( - ) blurriness of vision, ( - ) double vision, ( - ) watery eyes Ears, nose, mouth, throat, and face: ( - ) mucositis, ( - ) sore throat Respiratory: ( - ) cough, ( - ) dyspnea, ( - ) wheezes Cardiovascular: ( - ) palpitation, ( - ) chest discomfort, ( - ) lower extremity swelling Gastrointestinal:  ( - ) nausea, ( - ) heartburn, ( - ) change in bowel habits Skin: ( - ) abnormal skin rashes Lymphatics: ( - ) new lymphadenopathy, ( - ) easy bruising Neurological: ( - )  numbness, ( - ) tingling, ( - ) new weaknesses Behavioral/Psych: ( - ) mood change, ( - ) new changes  All other systems were reviewed with the patient and are negative.  PHYSICAL EXAMINATION: ECOG PERFORMANCE STATUS: 1 - Symptomatic but completely ambulatory  Vitals:   02/22/24 1122  BP: (!) 141/82  Pulse: 76  SpO2: 97%    Filed Weights   02/22/24 1122  Weight: 199 lb 14.4 oz (90.7 kg)     GENERAL: alert, no distress and comfortable SKIN: skin color, texture, turgor are normal, no rashes or significant lesions EYES:  conjunctiva are pink and non-injected, sclera clear NECK: supple, non-tender LYMPH:  no palpable lymphadenopathy in the cervical or supraclavicular region. Fullness noted in right neck s/p excisional biopsy. LUNGS: clear to auscultation and percussion with normal breathing effort HEART: regular rate & rhythm and no murmurs and no lower extremity edema Musculoskeletal: no cyanosis of digits and no clubbing  PSYCH: alert & oriented x 3, fluent speech NEURO: no focal motor/sensory deficits  LABORATORY DATA:  I have reviewed the data as listed    Latest Ref Rng & Units 02/22/2024   11:07 AM 02/18/2024   10:29 AM 02/01/2024    9:54 AM  CBC  WBC 4.0 - 10.5 K/uL 5.6  5.6  6.3   Hemoglobin 12.0 - 15.0 g/dL 88.0  87.0  87.1   Hematocrit 36.0 - 46.0 % 35.5  37.9  37.7   Platelets 150 - 400 K/uL 211  235  206        Latest Ref Rng & Units 02/22/2024   11:07 AM 02/18/2024   10:29 AM 02/01/2024    9:54 AM  CMP  Glucose 70 - 99 mg/dL 897  891  890   BUN 8 - 23 mg/dL 15  14  16    Creatinine 0.44 - 1.00 mg/dL 9.21  9.16  9.28   Sodium 135 - 145 mmol/L 138  141  141   Potassium 3.5 - 5.1 mmol/L 4.1  4.9  4.4   Chloride 98 - 111 mmol/L 104  105  108   CO2 22 - 32 mmol/L 28  30  29    Calcium 8.9 - 10.3 mg/dL 9.0  9.4  8.8   Total Protein 6.5 - 8.1 g/dL 6.3  6.7  6.6   Total Bilirubin 0.0 - 1.2 mg/dL 0.4  0.4  0.3   Alkaline Phos 38 - 126 U/L 77  82  84   AST 15 - 41  U/L 26  30  50   ALT 0 - 44 U/L 21  28  46     RADIOGRAPHIC STUDIES: I have personally reviewed the radiological images as listed and agreed with the findings in the report. IR IMAGING GUIDED PORT INSERTION Result Date: 02/21/2024 INDICATION: requesting port of administration of chemotherapy EXAM: IMPLANTED PORT A CATH PLACEMENT WITH ULTRASOUND AND FLUOROSCOPIC GUIDANCE MEDICATIONS: No antibiotics were administered 25 mg Benadryl  IV. ANESTHESIA/SEDATION: Moderate (conscious) sedation was employed during this procedure. A total of Versed  1.5 mg and Fentanyl  75 mcg was administered intravenously. Moderate Sedation Time: 19 minutes. The patient's level of consciousness and vital signs were monitored continuously by radiology nursing throughout the procedure under my direct supervision. FLUOROSCOPY: Radiation Exposure Index and estimated peak skin dose (PSD); Reference air kerma (RAK), 0.1 mGy. COMPLICATIONS: None immediate. PROCEDURE: The procedure, risks, benefits, and alternatives were explained to the patient. Questions regarding the procedure were encouraged and answered. The patient understands and consents to the procedure. The RIGHT neck and chest were prepped with chlorhexidine  in a sterile fashion, and a sterile drape was applied covering the operative field. Maximum barrier sterile technique with sterile gowns and gloves were used for the procedure. A timeout was performed prior to the initiation of the procedure. Local anesthesia was provided with 1% lidocaine  with epinephrine . After creating a small venotomy incision, a micropuncture kit was utilized to access the internal jugular vein under direct, real-time ultrasound guidance. Ultrasound image documentation was performed. The microwire was kinked to measure appropriate catheter length. A subcutaneous port pocket was then created  along the upper chest wall utilizing a combination of sharp and blunt dissection. The pocket was irrigated with sterile  saline. A single lumen power injectable port was chosen for placement. The 8 Fr catheter was tunneled from the port pocket site to the venotomy incision. The port was placed in the pocket. The external catheter was trimmed to appropriate length. At the venotomy, an 8 Fr peel-away sheath was placed over a guidewire under fluoroscopic guidance. The catheter was then placed through the sheath and the sheath was removed. Final catheter positioning was confirmed and documented with a fluoroscopic spot radiograph. The port was accessed with a Huber needle, aspirated and flushed with heparinized saline. The port pocket incision was closed with interrupted 3-0 Vicryl suture then Dermabond was applied, including at the venotomy incision. Dressings were placed. The patient tolerated the procedure well without immediate post procedural complication. IMPRESSION: Successful placement of a RIGHT internal jugular approach power injectable Port-A-Cath. The tip of the catheter is positioned at the superior cavo-atrial junction. the catheter is ready for immediate use. Thom Hall, MD [facility] Electronically Signed   By: Thom Hall M.D.   On: 02/21/2024 14:22   NM PET Image Initial (PI) Skull Base To Thigh (F-18 FDG) Result Date: 01/31/2024 CLINICAL DATA:  Initial treatment strategy for grade 3A follicular lymphoma of cervical nodes. EXAM: NUCLEAR MEDICINE PET SKULL BASE TO THIGH TECHNIQUE: 9.8 mCi F-18 FDG was injected intravenously. Full-ring PET imaging was performed from the skull base to thigh after the radiotracer. CT data was obtained and used for attenuation correction and anatomic localization. Fasting blood glucose: 111 mg/dl COMPARISON:  Neck CT 95/77/7974.  Abdominopelvic CT 04/14/2012. FINDINGS: Mediastinal blood pool activity: SUV max 2.7 Liver activity: SUV max 4.0 NECK: Bilateral hypermetabolic cervical nodes. Right-sided level 2 nodal mass measures 4.0  x 3.1 cm and a S.U.V. max of 10.6 on 23/4. Incidental CT findings: Deferred to recent diagnostic CT. CHEST: No pulmonary parenchymal hypermetabolism. Mediastinal and hilar nodal hypermetabolism. Example subcarinal node at 2.2 cm and a S.U.V. max of 7.2 on 61/4. Incidental CT findings: Mild cardiomegaly. Aortic atherosclerosis. Ascending aorta measures 4.0 cm. ABDOMEN/PELVIS: Mild splenic hypermetabolism at a S.U.V. max of 6.1. No splenomegaly. Extensive abdominopelvic nodal hypermetabolism. Example left periaortic nodal conglomerate at 1.9 cm and a S.U.V. max of 8.6 on 118/4. Nodes posterior to the cecum measure up to 12 mm and a S.U.V. max of 8.3 on 134/4. Index left external iliac node measures 2.5 cm and a S.U.V. max of 10.5 on 161/4. Incidental CT findings: Normal adrenal glands. Extensive colonic diverticulosis. Pelvic floor laxity. Pessary. Trace free pelvic fluid. SKELETON: No abnormal marrow activity. Incidental CT findings: None. IMPRESSION: 1. Evidence of active lymphoma within the neck, chest, abdomen, and pelvis. (Deauville) 5 2. Mild splenic hypermetabolism, suspicious for lymphomatous involvement. 3. Ascending aorta of 4.0 cm. This could either be re-evaluated on follow-up routine CT/PET or characterized with follow-up CTA in 1 year. 4. Incidental findings, including: Aortic Atherosclerosis (ICD10-I70.0). Trace free pelvic fluid. Electronically Signed   By: Rockey Kilts M.D.   On: 01/31/2024 18:30    ASSESSMENT & PLAN Tracy Bradley is a 68 y.o. female with medical history significant for follicular lymphoma, grade 3A, stage III who presents for a follow up visit.    # Follicular Lymphoma grade 3a, Stage III -- Needle core biopsy confirmed follicular lymphoma, grade 3A (high-grade). excisional biopsy confirmed no transformation to more aggressive lymphoma.  -- PET CT  scan confirms stage III disease with involvement on both sides of the diaphragm.  No evidence of organ invasion or bone marrow  involvement --gave the patient options for monotherapy rituximab versus Bendamustine and rituximab.  Both would be effective treatments.  Given her young age I do believe the combination of chemotherapy and immunotherapy would be the best option. --recommend Bendamustine and rituximab q. 28 days x 6 cycles. PLAN: -- Due to start Cycle 1, Day 1 of Bendamustine and rituximab on 02/25/2024 --labs today show WBC 5.6, Hgb 11.9, MCV 91.5, Plt 211. Creatinine and LFTs in range.  --Proceed with treatment as planned without any dose modifications --RTC for labs and follow up in 4 weeks prior to Cycle 2, Day 1.   #Supportive Care -- chemotherapy education completed -- port placement on 02/21/2024.  -- zofran  8mg  q8H PRN and compazine  10mg  PO q6H for nausea -- allopurinol  300mg  PO daily for TLS prophylaxis -- EMLA  cream for port --screened negative for hepatitis B on 02/01/2024.  -- no pain medication required at this time.    Orders Placed This Encounter  Procedures   CBC with Differential (Cancer Center Only)    Standing Status:   Future    Expected Date:   03/24/2024    Expiration Date:   03/24/2025   CMP (Cancer Center only)    Standing Status:   Future    Expected Date:   03/24/2024    Expiration Date:   03/24/2025   CBC with Differential (Cancer Center Only)    Standing Status:   Future    Expected Date:   04/21/2024    Expiration Date:   04/21/2025   CMP (Cancer Center only)    Standing Status:   Future    Expected Date:   04/21/2024    Expiration Date:   04/21/2025   CBC with Differential (Cancer Center Only)    Standing Status:   Future    Expected Date:   05/19/2024    Expiration Date:   05/19/2025   CMP (Cancer Center only)    Standing Status:   Future    Expected Date:   05/19/2024    Expiration Date:   05/19/2025    All questions were answered. The patient knows to call the clinic with any problems, questions or concerns.   I have spent a total of 25 minutes minutes of  face-to-face and non-face-to-face time, preparing to see the patient,performing a medically appropriate examination, counseling and educating the patient, documenting clinical information in the electronic health record, independently interpreting results and communicating results to the patient, and care coordination.    Johnston Police PA-C Dept of Hematology and Oncology [facility] Phone: [phone]   02/22/2024 12:50 PM

## 2024-02-25 ENCOUNTER — Inpatient Hospital Stay

## 2024-02-25 ENCOUNTER — Other Ambulatory Visit

## 2024-02-25 VITALS — BP 113/88 | HR 88 | Temp 98.1°F | Resp 18 | Wt 198.0 lb

## 2024-02-25 DIAGNOSIS — C8238 Follicular lymphoma grade IIIa, lymph nodes of multiple sites: Secondary | ICD-10-CM

## 2024-02-25 DIAGNOSIS — C823A Follicular lymphoma grade IIIA, in remission: Secondary | ICD-10-CM | POA: Diagnosis not present

## 2024-02-25 MED ORDER — HEPARIN SOD (PORK) LOCK FLUSH 100 UNIT/ML IV SOLN
500.0000 [IU] | Freq: Once | INTRAVENOUS | Status: AC | PRN
  Administered 2024-02-25: 500 [IU]

## 2024-02-25 MED ORDER — SODIUM CHLORIDE 0.9 % IV SOLN: INTRAVENOUS | Status: DC

## 2024-02-25 MED ORDER — SODIUM CHLORIDE 0.9 % IV SOLN
375.0000 mg/m2 | Freq: Once | INTRAVENOUS | Status: AC
  Administered 2024-02-25: 50 mg via INTRAVENOUS
  Filled 2024-02-25: qty 50

## 2024-02-25 MED ORDER — DIPHENHYDRAMINE HCL 25 MG PO CAPS
50.0000 mg | ORAL_CAPSULE | Freq: Once | ORAL | Status: AC
  Administered 2024-02-25: 50 mg via ORAL
  Filled 2024-02-25: qty 2

## 2024-02-25 MED ORDER — DEXAMETHASONE SODIUM PHOSPHATE 10 MG/ML IJ SOLN
10.0000 mg | Freq: Once | INTRAMUSCULAR | Status: AC
  Administered 2024-02-25: 10 mg via INTRAVENOUS
  Filled 2024-02-25: qty 1

## 2024-02-25 MED ORDER — ACETAMINOPHEN 325 MG PO TABS
650.0000 mg | ORAL_TABLET | Freq: Once | ORAL | Status: AC
Start: 2024-02-25 — End: 2024-02-25
  Administered 2024-02-25: 650 mg via ORAL
  Filled 2024-02-25: qty 2

## 2024-02-25 MED ORDER — PALONOSETRON HCL INJECTION 0.25 MG/5ML
0.2500 mg | Freq: Once | INTRAVENOUS | Status: AC
  Administered 2024-02-25: 0.25 mg via INTRAVENOUS
  Filled 2024-02-25: qty 5

## 2024-02-25 MED ORDER — SODIUM CHLORIDE 0.9 % IV SOLN
90.0000 mg/m2 | Freq: Once | INTRAVENOUS | Status: AC
  Administered 2024-02-25: 200 mg via INTRAVENOUS
  Filled 2024-02-25: qty 8

## 2024-02-25 MED ORDER — SODIUM CHLORIDE 0.9% FLUSH
10.0000 mL | INTRAVENOUS | Status: DC | PRN
  Administered 2024-02-25: 10 mL

## 2024-02-25 NOTE — Addendum Note (Signed)
 Addended byBETHA COHEN, Stace Peace on: 02/25/2024 09:44 AM   Modules accepted: Level of Service

## 2024-02-25 NOTE — Patient Instructions (Signed)
 [facility] - A DEPT OF [facility]  Discharge Instructions: Thank you for choosing [facility] to provide your oncology and hematology care.   If you have a lab appointment with the Cancer Center, please go directly to the Cancer Center and check in at the registration area.   Wear comfortable clothing and clothing appropriate for easy access to any Portacath or PICC line.   We strive to give you quality time with your provider. You may need to reschedule your appointment if you arrive late (15 or more minutes).  Arriving late affects you and other patients whose appointments are after yours.  Also, if you miss three or more appointments without notifying the office, you may be dismissed from the clinic at the provider's discretion.      For prescription refill requests, have your pharmacy contact our office and allow 72 hours for refills to be completed.    Today you received the following chemotherapy and/or immunotherapy agents: bendamustine (BENDEKA) and  riTUXimab-pvvr (RUXIENCE)      To help prevent nausea and vomiting after your treatment, we encourage you to take your nausea medication as directed.  BELOW ARE SYMPTOMS THAT SHOULD BE REPORTED IMMEDIATELY: *FEVER GREATER THAN 100.4 F (38 C) OR HIGHER *CHILLS OR SWEATING *NAUSEA AND VOMITING THAT IS NOT CONTROLLED WITH YOUR NAUSEA MEDICATION *UNUSUAL SHORTNESS OF BREATH *UNUSUAL BRUISING OR BLEEDING *URINARY PROBLEMS (pain or burning when urinating, or frequent urination) *BOWEL PROBLEMS (unusual diarrhea, constipation, pain near the anus) TENDERNESS IN MOUTH AND THROAT WITH OR WITHOUT PRESENCE OF ULCERS (sore throat, sores in mouth, or a toothache) UNUSUAL RASH, SWELLING OR PAIN  UNUSUAL VAGINAL DISCHARGE OR ITCHING   Items with * indicate a potential emergency and should be followed up as soon as possible or go to the Emergency Department if any problems should occur.  Please show  the CHEMOTHERAPY ALERT CARD or IMMUNOTHERAPY ALERT CARD at check-in to the Emergency Department and triage nurse.  Should you have questions after your visit or need to cancel or reschedule your appointment, please contact [facility] - A DEPT OF [facility]  Dept: [phone]  and follow the prompts.  Office hours are 8:00 a.m. to 4:30 p.m. Monday - Friday. Please note that voicemails left after 4:00 p.m. may not be returned until the following business day.  We are closed weekends and major holidays. You have access to a nurse at all times for urgent questions. Please call the main number to the clinic Dept: [phone] and follow the prompts.   For any non-urgent questions, you may also contact your provider using MyChart. We now offer e-Visits for anyone 60 and older to request care online for non-urgent symptoms. For details visit [email or web address].   Also download the MyChart app! Go to the app store, search MyChart, open the app, select [facility], and log in with your MyChart username and password.

## 2024-02-26 ENCOUNTER — Other Ambulatory Visit: Payer: Self-pay

## 2024-02-26 ENCOUNTER — Inpatient Hospital Stay: Attending: Hematology and Oncology

## 2024-02-26 VITALS — BP 112/78 | HR 78 | Temp 98.2°F | Resp 18

## 2024-02-26 DIAGNOSIS — D649 Anemia, unspecified: Secondary | ICD-10-CM | POA: Insufficient documentation

## 2024-02-26 DIAGNOSIS — Z79899 Other long term (current) drug therapy: Secondary | ICD-10-CM | POA: Diagnosis not present

## 2024-02-26 DIAGNOSIS — R5383 Other fatigue: Secondary | ICD-10-CM | POA: Diagnosis not present

## 2024-02-26 DIAGNOSIS — Z5112 Encounter for antineoplastic immunotherapy: Secondary | ICD-10-CM | POA: Diagnosis not present

## 2024-02-26 DIAGNOSIS — C823A Follicular lymphoma grade IIIA, in remission: Secondary | ICD-10-CM | POA: Insufficient documentation

## 2024-02-26 DIAGNOSIS — C8238 Follicular lymphoma grade IIIa, lymph nodes of multiple sites: Secondary | ICD-10-CM

## 2024-02-26 DIAGNOSIS — Z803 Family history of malignant neoplasm of breast: Secondary | ICD-10-CM | POA: Diagnosis not present

## 2024-02-26 MED ORDER — SODIUM CHLORIDE 0.9 % IV SOLN: INTRAVENOUS | Status: DC

## 2024-02-26 MED ORDER — SODIUM CHLORIDE 0.9 % IV SOLN
90.0000 mg/m2 | Freq: Once | INTRAVENOUS | Status: AC
  Administered 2024-02-26: 200 mg via INTRAVENOUS
  Filled 2024-02-26: qty 8

## 2024-02-26 MED ORDER — DEXAMETHASONE SODIUM PHOSPHATE 10 MG/ML IJ SOLN
10.0000 mg | Freq: Once | INTRAMUSCULAR | Status: AC
  Administered 2024-02-26: 10 mg via INTRAVENOUS

## 2024-02-26 NOTE — Patient Instructions (Signed)
 [facility] - A DEPT OF [facility]  Discharge Instructions: Thank you for choosing [facility] to provide your oncology and hematology care.   If you have a lab appointment with the Cancer Center, please go directly to the Cancer Center and check in at the registration area.   Wear comfortable clothing and clothing appropriate for easy access to any Portacath or PICC line.   We strive to give you quality time with your provider. You may need to reschedule your appointment if you arrive late (15 or more minutes).  Arriving late affects you and other patients whose appointments are after yours.  Also, if you miss three or more appointments without notifying the office, you may be dismissed from the clinic at the provider's discretion.      For prescription refill requests, have your pharmacy contact our office and allow 72 hours for refills to be completed.    Today you received the following chemotherapy and/or immunotherapy agents: bendamustine [facility])  To help prevent nausea and vomiting after your treatment, we encourage you to take your nausea medication as directed.  BELOW ARE SYMPTOMS THAT SHOULD BE REPORTED IMMEDIATELY: *FEVER GREATER THAN 100.4 F (38 C) OR HIGHER *CHILLS OR SWEATING *NAUSEA AND VOMITING THAT IS NOT CONTROLLED WITH YOUR NAUSEA MEDICATION *UNUSUAL SHORTNESS OF BREATH *UNUSUAL BRUISING OR BLEEDING *URINARY PROBLEMS (pain or burning when urinating, or frequent urination) *BOWEL PROBLEMS (unusual diarrhea, constipation, pain near the anus) TENDERNESS IN MOUTH AND THROAT WITH OR WITHOUT PRESENCE OF ULCERS (sore throat, sores in mouth, or a toothache) UNUSUAL RASH, SWELLING OR PAIN  UNUSUAL VAGINAL DISCHARGE OR ITCHING   Items with * indicate a potential emergency and should be followed up as soon as possible or go to the Emergency Department if any problems should occur.  Please show the CHEMOTHERAPY ALERT CARD or  IMMUNOTHERAPY ALERT CARD at check-in to the Emergency Department and triage nurse.  Should you have questions after your visit or need to cancel or reschedule your appointment, please contact [facility]  Dept: [phone]  and follow the prompts.  Office hours are 8:00 a.m. to 4:30 p.m. Monday - Friday. Please note that voicemails left after 4:00 p.m. may not be returned until the following business day.  We are closed weekends and major holidays. You have access to a nurse at all times for urgent questions. Please call the main number to the clinic Dept: [phone] and follow the prompts.   For any non-urgent questions, you may also contact your provider using MyChart. We now offer e-Visits for anyone 46 and older to request care online for non-urgent symptoms. For details visit [email or web address].   Also download the MyChart app! Go to the app store, search MyChart, open the app, select [facility], and log in with your MyChart username and password.

## 2024-02-27 ENCOUNTER — Telehealth: Payer: Self-pay

## 2024-02-27 ENCOUNTER — Inpatient Hospital Stay: Admitting: Hematology and Oncology

## 2024-02-27 ENCOUNTER — Inpatient Hospital Stay

## 2024-02-27 NOTE — Telephone Encounter (Signed)
LM for patient that this nurse was calling to see how they were doing after their treatment. Please call back to Dr.  Dorsey's nurse at [phone] if they have any questions or concerns regarding the treatment.  ?

## 2024-02-27 NOTE — Telephone Encounter (Signed)
-----   Message from Nurse Milo KIDD sent at 02/25/2024  4:12 PM EDT ----- Regarding: First time Rituxmab and Bendeka Dr. Lafonda patient first time Rituxmab and Bendeka, tolerated treatment well.

## 2024-03-04 NOTE — Progress Notes (Signed)
 Introduced myself to patient during appointment with Johnston. Explained my role in her care and gave patient information to [place].

## 2024-03-17 ENCOUNTER — Encounter: Payer: Self-pay | Admitting: Hematology and Oncology

## 2024-03-18 ENCOUNTER — Other Ambulatory Visit: Payer: Self-pay | Admitting: Hematology and Oncology

## 2024-03-18 ENCOUNTER — Other Ambulatory Visit (INDEPENDENT_AMBULATORY_CARE_PROVIDER_SITE_OTHER): Payer: Self-pay | Admitting: Otolaryngology

## 2024-03-19 ENCOUNTER — Encounter: Payer: Self-pay | Admitting: Hematology and Oncology

## 2024-03-21 ENCOUNTER — Other Ambulatory Visit: Payer: Self-pay

## 2024-03-21 NOTE — Progress Notes (Signed)
 Rapid Infusion Rituximab  Pharmacist Evaluation  Tracy Bradley is a 68 y.o. female being treated with rituximab  for follicular lymphoma. This patient may be considered for RIR.   A pharmacist has verified the patient tolerated rituximab  infusions per the [facility] standard infusion protocol without grade 3-4 infusion reactions. The treatment plan will be updated to reflect RIR if the patient qualifies per the checklist below:   Age > 40 years old Yes   Clinically significant cardiovascular disease No   Circulating lymphocyte count < 5000/uL prior to cycle two Yes  Lab Results  Component Value Date   LYMPHSABS 1.2 02/22/2024    Prior documented grade 3-4 infusion reaction to rituximab  No   Prior documented grade 1-2 infusion reaction to rituximab  (If YES, Pharmacist will confirm with Physician if patient is still a candidate for RIR) No   Previous rituximab  infusion within the past 6 months Yes   Treatment Plan updated orders to reflect RIR Yes    Tracy Bradley does meet the criteria for Rapid Infusion Rituximab . This patient is going to be switched to rapid infusion rituximab .   Tracy Bradley, PharmD, MBA

## 2024-03-24 ENCOUNTER — Encounter: Payer: Self-pay | Admitting: Hematology and Oncology

## 2024-03-24 ENCOUNTER — Inpatient Hospital Stay

## 2024-03-24 ENCOUNTER — Inpatient Hospital Stay: Payer: Self-pay

## 2024-03-24 ENCOUNTER — Inpatient Hospital Stay (HOSPITAL_BASED_OUTPATIENT_CLINIC_OR_DEPARTMENT_OTHER): Admitting: Hematology and Oncology

## 2024-03-24 VITALS — BP 126/77 | HR 65 | Temp 97.7°F | Resp 16

## 2024-03-24 VITALS — BP 137/78 | HR 82 | Temp 97.6°F | Resp 15 | Wt 198.5 lb

## 2024-03-24 DIAGNOSIS — C823A Follicular lymphoma grade IIIA, in remission: Secondary | ICD-10-CM | POA: Diagnosis not present

## 2024-03-24 DIAGNOSIS — Z95828 Presence of other vascular implants and grafts: Secondary | ICD-10-CM

## 2024-03-24 DIAGNOSIS — C8231 Follicular lymphoma grade IIIa, lymph nodes of head, face, and neck: Secondary | ICD-10-CM

## 2024-03-24 DIAGNOSIS — C8238 Follicular lymphoma grade IIIa, lymph nodes of multiple sites: Secondary | ICD-10-CM

## 2024-03-24 LAB — CBC WITH DIFFERENTIAL (CANCER CENTER ONLY)
Abs Immature Granulocytes: 0.02 K/uL (ref 0.00–0.07)
Basophils Absolute: 0.1 K/uL (ref 0.0–0.1)
Basophils Relative: 1 %
Eosinophils Absolute: 0.3 K/uL (ref 0.0–0.5)
Eosinophils Relative: 6 %
HCT: 35.2 % — ABNORMAL LOW (ref 36.0–46.0)
Hemoglobin: 11.7 g/dL — ABNORMAL LOW (ref 12.0–15.0)
Immature Granulocytes: 0 %
Lymphocytes Relative: 7 %
Lymphs Abs: 0.4 K/uL — ABNORMAL LOW (ref 0.7–4.0)
MCH: 30.9 pg (ref 26.0–34.0)
MCHC: 33.2 g/dL (ref 30.0–36.0)
MCV: 92.9 fL (ref 80.0–100.0)
Monocytes Absolute: 0.6 K/uL (ref 0.1–1.0)
Monocytes Relative: 13 %
Neutro Abs: 3.7 K/uL (ref 1.7–7.7)
Neutrophils Relative %: 73 %
Platelet Count: 217 K/uL (ref 150–400)
RBC: 3.79 MIL/uL — ABNORMAL LOW (ref 3.87–5.11)
RDW: 13.4 % (ref 11.5–15.5)
WBC Count: 5 K/uL (ref 4.0–10.5)
nRBC: 0 % (ref 0.0–0.2)

## 2024-03-24 LAB — CMP (CANCER CENTER ONLY)
ALT: 21 U/L (ref 0–44)
AST: 25 U/L (ref 15–41)
Albumin: 4.1 g/dL (ref 3.5–5.0)
Alkaline Phosphatase: 90 U/L (ref 38–126)
Anion gap: 6 (ref 5–15)
BUN: 14 mg/dL (ref 8–23)
CO2: 28 mmol/L (ref 22–32)
Calcium: 9.1 mg/dL (ref 8.9–10.3)
Chloride: 106 mmol/L (ref 98–111)
Creatinine: 0.76 mg/dL (ref 0.44–1.00)
GFR, Estimated: >60 mL/min (ref >60)
Glucose, Bld: 111 mg/dL — ABNORMAL HIGH (ref 70–99)
Potassium: 3.9 mmol/L (ref 3.5–5.1)
Sodium: 140 mmol/L (ref 135–145)
Total Bilirubin: 0.5 mg/dL (ref 0.0–1.2)
Total Protein: 6.9 g/dL (ref 6.5–8.1)

## 2024-03-24 MED ORDER — SODIUM CHLORIDE 0.9 % IV SOLN
90.0000 mg/m2 | Freq: Once | INTRAVENOUS | Status: AC
  Administered 2024-03-24: 200 mg via INTRAVENOUS
  Filled 2024-03-24: qty 8

## 2024-03-24 MED ORDER — DEXAMETHASONE SODIUM PHOSPHATE 10 MG/ML IJ SOLN
10.0000 mg | Freq: Once | INTRAMUSCULAR | Status: AC
  Administered 2024-03-24: 10 mg via INTRAVENOUS
  Filled 2024-03-24: qty 1

## 2024-03-24 MED ORDER — SODIUM CHLORIDE 0.9% FLUSH
10.0000 mL | INTRAVENOUS | Status: DC | PRN
Start: 2024-03-24 — End: 2024-03-24

## 2024-03-24 MED ORDER — SODIUM CHLORIDE 0.9 % IV SOLN
375.0000 mg/m2 | Freq: Once | INTRAVENOUS | Status: AC
  Administered 2024-03-24: 800 mg via INTRAVENOUS
  Filled 2024-03-24: qty 50

## 2024-03-24 MED ORDER — DIPHENHYDRAMINE HCL 25 MG PO CAPS
50.0000 mg | ORAL_CAPSULE | Freq: Once | ORAL | Status: AC
  Administered 2024-03-24: 50 mg via ORAL
  Filled 2024-03-24: qty 2

## 2024-03-24 MED ORDER — SODIUM CHLORIDE 0.9 % IV SOLN
INTRAVENOUS | Status: DC
Start: 2024-03-24 — End: 2024-03-24

## 2024-03-24 MED ORDER — PALONOSETRON HCL INJECTION 0.25 MG/5ML
0.2500 mg | Freq: Once | INTRAVENOUS | Status: AC
  Administered 2024-03-24: 0.25 mg via INTRAVENOUS
  Filled 2024-03-24: qty 5

## 2024-03-24 MED ORDER — ACETAMINOPHEN 325 MG PO TABS
650.0000 mg | ORAL_TABLET | Freq: Once | ORAL | Status: AC
  Administered 2024-03-24: 650 mg via ORAL
  Filled 2024-03-24: qty 2

## 2024-03-24 NOTE — Progress Notes (Signed)
 [facility] [phone]   Fax:([phone]  PROGRESS NOTE  Patient Care Team: Katrinka Garnette KIDD, MD as PCP - General (Family Medicine) Cloretta Arley NOVAK, MD as Consulting Physician (Hematology and Oncology) Elana Montie CROME, RN as Nurse Navigator  Hematological/Oncological History # Follicular Lymphoma Stage III, Grade 3a  12/26/2023: right cervical lymph node biopsy showed follicular lymphoma with focal centroblasts approaching 15/HPF suggestive of grade 3A, i.e. high-grade, follicular lymphoma  01/16/2024: establish care with Dr. Federico  01/25/2024: NM PET CT scan showed evidence of active lymphoma within the neck, chest, abdomen, and pelvis. ([place]) 5, as well as mild splenic hypermetabolism  02/25/2024: Cycle 1 Day 1 of R-Benda Chemotherapy 03/24/2024:  Cycle 2 Day 1 of R-Benda Chemotherapy   Interval History:  Tracy Bradley 68 y.o. female with medical history significant for follicular lymphoma, grade 3A, stage III who presents for a follow up visit. The patient's last visit was on 03/24/2024. In the interim since the last visit she started R-Benda chemotherapy.   On exam today Tracy Bradley is accompanied by her husband.  She reports she tolerated her first cycle of chemotherapy well with no major side effects.  She reports she had some fatigue week 2 but overall tolerated it well.  She does have some dry mouth as well.  She had no nausea, vomiting, or diarrhea.  She denies any fevers, chills, sweats.  She notes that she has noticed some difference with the lymphadenopathy in her neck.  She reports that she is eating well and keeping up with hydration.  She is having no lightheadedness, dizziness, or shortness of breath.  She denies any runny nose, sore throat, cough.  She is also not having any pain anywhere.  Overall she is willing and able to proceed with chemotherapy at this time.  A full 10 point ROS is otherwise negative.  MEDICAL HISTORY:  Past Medical  History:  Diagnosis Date   Anemia    since childhood   Ileus following gastrointestinal surgery (HCC) 04/22/2012   Muscle strain 04/22/2012    SURGICAL HISTORY: Past Surgical History:  Procedure Laterality Date   BACK SURGERY     1980- low back ruptured disk   IR IMAGING GUIDED PORT INSERTION  02/21/2024   LAPAROSCOPIC APPENDECTOMY  04/14/2012   Procedure: APPENDECTOMY LAPAROSCOPIC;  Surgeon: Alm VEAR Angle, MD;  Location: [facility];  Service: General;  Laterality: N/A;   LYMPH NODE BIOPSY Right 02/06/2024   Procedure: RIGHT NECK CERVICAL LYMPH NODE BIOPSY;  Surgeon: Tobie Eldora NOVAK, MD;  Location: [facility] OR;  Service: ENT;  Laterality: Right;   TONSILLECTOMY     1980s    SOCIAL HISTORY: Social History   Socioeconomic History   Marital status: Married    Spouse name: Not on file   Number of children: Not on file   Years of education: Not on file   Highest education level: Not on file  Occupational History   Not on file  Tobacco Use   Smoking status: Never   Smokeless tobacco: Never  Vaping Use   Vaping status: Never Used  Substance and Sexual Activity   Alcohol use: No   Drug use: No   Sexual activity: Yes    Birth control/protection: Post-menopausal    Comment: husband only  Other Topics Concern   Not on file  Social History Narrative   Married. 3 children- 40, 38, 25 in 2025- 2 sons and a daughter. No grandkids yet.  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 drug abuse (children had gone there)   Social Drivers of Corporate investment banker Strain: Not on file  Food Insecurity: No Food Insecurity (01/16/2024)   Hunger Vital Sign    Worried About Running Out of Food in the Last Year: Never true    Ran Out of Food in the Last Year: Never true  Transportation Needs: No Transportation Needs (01/16/2024)   PRAPARE - Administrator, Civil Service (Medical): No    Lack of  Transportation (Non-Medical): No  Physical Activity: Not on file  Stress: Not on file  Social Connections: Not on file  Intimate Partner Violence: Not At Risk (01/16/2024)   Humiliation, Afraid, Rape, and Kick questionnaire    Fear of Current or Ex-Partner: No    Emotionally Abused: No    Physically Abused: No    Sexually Abused: No    FAMILY HISTORY: Family History  Problem Relation Age of Onset   Stroke Mother    Lymphoma Father    Drug abuse Daughter    Depression Daughter    Bipolar disorder Daughter    Breast cancer Maternal Aunt 18 - 70   Breast cancer Paternal Aunt 77 - 67   Stroke Maternal Grandmother    Stroke Paternal Grandfather    Diabetes Brother    Hyperlipidemia Brother    Kidney disease Brother        dehydration and stopped dm meds etc- thankfully had recovery   Depression Brother    Drug abuse Brother    Drug abuse Son    Depression Son    BRCA 1/2 Neg Hx     ALLERGIES:  has no known allergies.  MEDICATIONS:  Current Outpatient Medications  Medication Sig Dispense Refill   acetaminophen  (TYLENOL ) 500 MG tablet Take 2 tablets (1,000 mg total) by mouth every 6 (six) hours as needed. 30 tablet 0   allopurinol  (ZYLOPRIM ) 300 MG tablet Take 1 tablet (300 mg total) by mouth daily. 90 tablet 1   ibuprofen  (ADVIL ) 200 MG tablet Take 2 tablets (400 mg total) by mouth every 6 (six) hours as needed. 30 tablet 0   lidocaine -prilocaine  (EMLA ) cream Apply 1 Application topically as needed. 30 g 0   Multiple Vitamins-Minerals (MULTIVITAMIN WITH MINERALS) tablet Take 1 tablet by mouth daily.     ondansetron  (ZOFRAN ) 8 MG tablet Take 1 tablet (8 mg total) by mouth every 8 (eight) hours as needed. 30 tablet 0   prochlorperazine  (COMPAZINE ) 10 MG tablet TAKE 1 TABLET BY MOUTH EVERY 6 HOURS AS NEEDED FOR NAUSEA OR VOMITING. 30 tablet 0   No current facility-administered medications for this visit.    REVIEW OF SYSTEMS:   Constitutional: ( - ) fevers, ( - )  chills , (  - ) night sweats Eyes: ( - ) blurriness of vision, ( - ) double vision, ( - ) watery eyes Ears, nose, mouth, throat, and face: ( - ) mucositis, ( - ) sore throat Respiratory: ( - ) cough, ( - ) dyspnea, ( - ) wheezes Cardiovascular: ( - ) palpitation, ( - ) chest discomfort, ( - ) lower extremity swelling Gastrointestinal:  ( - ) nausea, ( - ) heartburn, ( - ) change in bowel habits Skin: ( - ) abnormal skin rashes Lymphatics: ( - ) new lymphadenopathy, ( - ) easy bruising Neurological: ( - ) numbness, ( - )  tingling, ( - ) new weaknesses Behavioral/Psych: ( - ) mood change, ( - ) new changes  All other systems were reviewed with the patient and are negative.  PHYSICAL EXAMINATION: ECOG PERFORMANCE STATUS: 1 - Symptomatic but completely ambulatory  Vitals:   03/24/24 1035  BP: 137/78  Pulse: 82  Resp: 15  Temp: 97.6 F (36.4 C)  SpO2: 98%     Filed Weights   03/24/24 1035  Weight: 198 lb 8 oz (90 kg)      GENERAL: alert, no distress and comfortable SKIN: skin color, texture, turgor are normal, no rashes or significant lesions EYES: conjunctiva are pink and non-injected, sclera clear OROPHARYNX: no exudate, no erythema; lips, buccal mucosa, and tongue normal  NECK: supple, non-tender LYMPH:  no palpable lymphadenopathy in the cervical, axillary or inguinal LUNGS: clear to auscultation and percussion with normal breathing effort HEART: regular rate & rhythm and no murmurs and no lower extremity edema ABDOMEN: soft, non-tender, non-distended, normal bowel sounds Musculoskeletal: no cyanosis of digits and no clubbing  PSYCH: alert & oriented x 3, fluent speech NEURO: no focal motor/sensory deficits  LABORATORY DATA:  I have reviewed the data as listed    Latest Ref Rng & Units 03/24/2024   10:15 AM 02/22/2024   11:07 AM 02/18/2024   10:29 AM  CBC  WBC 4.0 - 10.5 K/uL 5.0  5.6  5.6   Hemoglobin 12.0 - 15.0 g/dL 88.2  88.0  87.0   Hematocrit 36.0 - 46.0 % 35.2  35.5   37.9   Platelets 150 - 400 K/uL 217  211  235        Latest Ref Rng & Units 03/24/2024   10:15 AM 02/22/2024   11:07 AM 02/18/2024   10:29 AM  CMP  Glucose 70 - 99 mg/dL 888  897  891   BUN 8 - 23 mg/dL 14  15  14    Creatinine 0.44 - 1.00 mg/dL 9.23  9.21  9.16   Sodium 135 - 145 mmol/L 140  138  141   Potassium 3.5 - 5.1 mmol/L 3.9  4.1  4.9   Chloride 98 - 111 mmol/L 106  104  105   CO2 22 - 32 mmol/L 28  28  30    Calcium 8.9 - 10.3 mg/dL 9.1  9.0  9.4   Total Protein 6.5 - 8.1 g/dL 6.9  6.3  6.7   Total Bilirubin 0.0 - 1.2 mg/dL 0.5  0.4  0.4   Alkaline Phos 38 - 126 U/L 90  77  82   AST 15 - 41 U/L 25  26  30    ALT 0 - 44 U/L 21  21  28      RADIOGRAPHIC STUDIES: I have personally reviewed the radiological images as listed and agreed with the findings in the report. No results found.   ASSESSMENT & PLAN Tracy Bradley 68 y.o. female with medical history significant for follicular lymphoma, grade 3A, stage III who presents for a follow up visit.  After review of the labs, review of the records, and discussion with the patient the patients findings are most consistent with high-grade follicular lymphoma, staging in process.   Previously we discussed the nature of follicular lymphoma and steps moving forward.  We discussed that it tends to be a more indolent lymphoma and the treatment may not be required immediately.  We discussed criteria for treatment including bulky disease as well as rapid progression.  Additionally we discussed that her  blood counts appear stable at last check, we will recheck these today.  Pending results that scan we will will determine next best steps moving forward.  The patient voiced understanding of our findings, the nature of the disease, and the plan moving forward.    # Follicular Lymphoma grade 3a, Stage III -- Needle core biopsy confirmed follicular lymphoma, grade 3A (high-grade). excisional biopsy confirmed no transformation to more aggressive  lymphoma.  -- PET CT scan confirms stage III disease with involvement on both sides of the diaphragm.  No evidence of organ invasion or bone marrow involvement --port placement scheduled for 02/21/2024.  --gave the patient options for monotherapy rituximab  versus Bendamustine  and rituximab .  Both would be effective treatments.  Given her young age I do believe the combination of chemotherapy and immunotherapy would be the best option, though immunotherapy alone would be reasonable.  Presented these options to the patient today. PLAN: -- will proceed with Bendamustine  and rituximab  q. 28 days x 6 cycles. Today is Cycle 2 Day 1  --labs today show WBC 5.0, hemoglobin 11.7, MCV 92.9, platelets 217 -- Plan for return to clinic for next cycle on 04/21/2024   #Supportive Care -- chemotherapy education complete -- port placement on 02/21/2024.  -- zofran  8mg  q8H PRN and compazine  10mg  PO q6H for nausea -- allopurinol  300mg  PO daily for TLS prophylaxis -- EMLA  cream for port --screened negative for hepatitis B on 02/01/2024.  -- no pain medication required at this time.    No orders of the defined types were placed in this encounter.   All questions were answered. The patient knows to call the clinic with any problems, questions or concerns.  A total of more than 30 minutes were spent on this encounter with face-to-face time and non-face-to-face time, including preparing to see the patient, ordering tests and/or medications, counseling the patient and coordination of care as outlined above.   Norleen IVAR Kidney, MD Department of Hematology/Oncology [facility] Phone: [phone] Pager: [phone] Email: [email or web address]  03/24/2024 11:09 AM

## 2024-03-24 NOTE — Patient Instructions (Signed)
 [facility] - A DEPT OF [facility]  Discharge Instructions: Thank you for choosing [facility] to provide your oncology and hematology care.   If you have a lab appointment with the Cancer Center, please go directly to the Cancer Center and check in at the registration area.   Wear comfortable clothing and clothing appropriate for easy access to any Portacath or PICC line.   We strive to give you quality time with your provider. You may need to reschedule your appointment if you arrive late (15 or more minutes).  Arriving late affects you and other patients whose appointments are after yours.  Also, if you miss three or more appointments without notifying the office, you may be dismissed from the clinic at the provider's discretion.      For prescription refill requests, have your pharmacy contact our office and allow 72 hours for refills to be completed.    Today you received the following chemotherapy and/or immunotherapy agents: rituximab  and bendamustine       To help prevent nausea and vomiting after your treatment, we encourage you to take your nausea medication as directed.  BELOW ARE SYMPTOMS THAT SHOULD BE REPORTED IMMEDIATELY: *FEVER GREATER THAN 100.4 F (38 C) OR HIGHER *CHILLS OR SWEATING *NAUSEA AND VOMITING THAT IS NOT CONTROLLED WITH YOUR NAUSEA MEDICATION *UNUSUAL SHORTNESS OF BREATH *UNUSUAL BRUISING OR BLEEDING *URINARY PROBLEMS (pain or burning when urinating, or frequent urination) *BOWEL PROBLEMS (unusual diarrhea, constipation, pain near the anus) TENDERNESS IN MOUTH AND THROAT WITH OR WITHOUT PRESENCE OF ULCERS (sore throat, sores in mouth, or a toothache) UNUSUAL RASH, SWELLING OR PAIN  UNUSUAL VAGINAL DISCHARGE OR ITCHING   Items with * indicate a potential emergency and should be followed up as soon as possible or go to the Emergency Department if any problems should occur.  Please show the CHEMOTHERAPY ALERT CARD  or IMMUNOTHERAPY ALERT CARD at check-in to the Emergency Department and triage nurse.  Should you have questions after your visit or need to cancel or reschedule your appointment, please contact [facility] - A DEPT OF [facility]  Dept: [phone]  and follow the prompts.  Office hours are 8:00 a.m. to 4:30 p.m. Monday - Friday. Please note that voicemails left after 4:00 p.m. may not be returned until the following business day.  We are closed weekends and major holidays. You have access to a nurse at all times for urgent questions. Please call the main number to the clinic Dept: [phone] and follow the prompts.   For any non-urgent questions, you may also contact your provider using MyChart. We now offer e-Visits for anyone 65 and older to request care online for non-urgent symptoms. For details visit [email or web address].   Also download the MyChart app! Go to the app store, search MyChart, open the app, select [place], and log in with your MyChart username and password.

## 2024-03-25 ENCOUNTER — Ambulatory Visit

## 2024-03-25 ENCOUNTER — Inpatient Hospital Stay

## 2024-03-25 VITALS — BP 106/69 | HR 71 | Temp 98.1°F | Resp 14

## 2024-03-25 DIAGNOSIS — C823A Follicular lymphoma grade IIIA, in remission: Secondary | ICD-10-CM | POA: Diagnosis not present

## 2024-03-25 DIAGNOSIS — C8238 Follicular lymphoma grade IIIa, lymph nodes of multiple sites: Secondary | ICD-10-CM

## 2024-03-25 MED ORDER — SODIUM CHLORIDE 0.9 % IV SOLN
90.0000 mg/m2 | Freq: Once | INTRAVENOUS | Status: AC
  Administered 2024-03-25: 200 mg via INTRAVENOUS
  Filled 2024-03-25: qty 8

## 2024-03-25 MED ORDER — DEXAMETHASONE SODIUM PHOSPHATE 10 MG/ML IJ SOLN
10.0000 mg | Freq: Once | INTRAMUSCULAR | Status: AC
  Administered 2024-03-25: 10 mg via INTRAVENOUS
  Filled 2024-03-25: qty 1

## 2024-03-25 MED ORDER — SODIUM CHLORIDE 0.9% FLUSH
10.0000 mL | INTRAVENOUS | Status: DC | PRN
Start: 2024-03-25 — End: 2024-03-25
  Administered 2024-03-25: 10 mL

## 2024-03-25 MED ORDER — SODIUM CHLORIDE 0.9 % IV SOLN
INTRAVENOUS | Status: DC
Start: 2024-03-25 — End: 2024-03-25

## 2024-03-25 MED ORDER — HEPARIN SOD (PORK) LOCK FLUSH 100 UNIT/ML IV SOLN
500.0000 [IU] | Freq: Once | INTRAVENOUS | Status: AC | PRN
Start: 2024-03-25 — End: 2024-03-25
  Administered 2024-03-25: 500 [IU]

## 2024-03-25 NOTE — Patient Instructions (Signed)
 [facility] - A DEPT OF [facility]  Discharge Instructions: Thank you for choosing [facility] to provide your oncology and hematology care.   If you have a lab appointment with the Cancer Center, please go directly to the Cancer Center and check in at the registration area.   Wear comfortable clothing and clothing appropriate for easy access to any Portacath or PICC line.   We strive to give you quality time with your provider. You may need to reschedule your appointment if you arrive late (15 or more minutes).  Arriving late affects you and other patients whose appointments are after yours.  Also, if you miss three or more appointments without notifying the office, you may be dismissed from the clinic at the provider's discretion.      For prescription refill requests, have your pharmacy contact our office and allow 72 hours for refills to be completed.    Today you received the following chemotherapy and/or immunotherapy agents: Bendeka      To help prevent nausea and vomiting after your treatment, we encourage you to take your nausea medication as directed.  BELOW ARE SYMPTOMS THAT SHOULD BE REPORTED IMMEDIATELY: *FEVER GREATER THAN 100.4 F (38 C) OR HIGHER *CHILLS OR SWEATING *NAUSEA AND VOMITING THAT IS NOT CONTROLLED WITH YOUR NAUSEA MEDICATION *UNUSUAL SHORTNESS OF BREATH *UNUSUAL BRUISING OR BLEEDING *URINARY PROBLEMS (pain or burning when urinating, or frequent urination) *BOWEL PROBLEMS (unusual diarrhea, constipation, pain near the anus) TENDERNESS IN MOUTH AND THROAT WITH OR WITHOUT PRESENCE OF ULCERS (sore throat, sores in mouth, or a toothache) UNUSUAL RASH, SWELLING OR PAIN  UNUSUAL VAGINAL DISCHARGE OR ITCHING   Items with * indicate a potential emergency and should be followed up as soon as possible or go to the Emergency Department if any problems should occur.  Please show the CHEMOTHERAPY ALERT CARD or IMMUNOTHERAPY  ALERT CARD at check-in to the Emergency Department and triage nurse.  Should you have questions after your visit or need to cancel or reschedule your appointment, please contact [facility]  Dept: [phone]  and follow the prompts.  Office hours are 8:00 a.m. to 4:30 p.m. Monday - Friday. Please note that voicemails left after 4:00 p.m. may not be returned until the following business day.  We are closed weekends and major holidays. You have access to a nurse at all times for urgent questions. Please call the main number to the clinic Dept: [phone] and follow the prompts.   For any non-urgent questions, you may also contact your provider using MyChart. We now offer e-Visits for anyone 73 and older to request care online for non-urgent symptoms. For details visit [email or web address].   Also download the MyChart app! Go to the app store, search "MyChart", open the app, select [place], and log in with your MyChart username and password.

## 2024-03-27 ENCOUNTER — Encounter: Payer: Self-pay | Admitting: Hematology and Oncology

## 2024-04-04 ENCOUNTER — Ambulatory Visit: Admitting: Obstetrics and Gynecology

## 2024-04-06 ENCOUNTER — Other Ambulatory Visit: Payer: Self-pay | Admitting: Hematology and Oncology

## 2024-04-21 ENCOUNTER — Inpatient Hospital Stay

## 2024-04-21 ENCOUNTER — Inpatient Hospital Stay: Attending: Hematology and Oncology | Admitting: Hematology and Oncology

## 2024-04-21 VITALS — BP 110/65 | HR 74 | Temp 97.8°F | Resp 16 | Ht 68.5 in | Wt 200.9 lb

## 2024-04-21 DIAGNOSIS — D649 Anemia, unspecified: Secondary | ICD-10-CM | POA: Diagnosis not present

## 2024-04-21 DIAGNOSIS — Z95828 Presence of other vascular implants and grafts: Secondary | ICD-10-CM

## 2024-04-21 DIAGNOSIS — R5383 Other fatigue: Secondary | ICD-10-CM | POA: Diagnosis not present

## 2024-04-21 DIAGNOSIS — Z806 Family history of leukemia: Secondary | ICD-10-CM | POA: Insufficient documentation

## 2024-04-21 DIAGNOSIS — C823A Follicular lymphoma grade IIIA, in remission: Secondary | ICD-10-CM | POA: Insufficient documentation

## 2024-04-21 DIAGNOSIS — C8238 Follicular lymphoma grade IIIa, lymph nodes of multiple sites: Secondary | ICD-10-CM | POA: Diagnosis not present

## 2024-04-21 DIAGNOSIS — Z79899 Other long term (current) drug therapy: Secondary | ICD-10-CM | POA: Diagnosis not present

## 2024-04-21 DIAGNOSIS — Z803 Family history of malignant neoplasm of breast: Secondary | ICD-10-CM | POA: Diagnosis not present

## 2024-04-21 DIAGNOSIS — Z5111 Encounter for antineoplastic chemotherapy: Secondary | ICD-10-CM | POA: Diagnosis not present

## 2024-04-21 LAB — CBC WITH DIFFERENTIAL (CANCER CENTER ONLY)
Abs Immature Granulocytes: 0.01 K/uL (ref 0.00–0.07)
Basophils Absolute: 0.1 K/uL (ref 0.0–0.1)
Basophils Relative: 1 %
Eosinophils Absolute: 0.3 K/uL (ref 0.0–0.5)
Eosinophils Relative: 7 %
HCT: 34.3 % — ABNORMAL LOW (ref 36.0–46.0)
Hemoglobin: 11.6 g/dL — ABNORMAL LOW (ref 12.0–15.0)
Immature Granulocytes: 0 %
Lymphocytes Relative: 17 %
Lymphs Abs: 0.8 K/uL (ref 0.7–4.0)
MCH: 31.7 pg (ref 26.0–34.0)
MCHC: 33.8 g/dL (ref 30.0–36.0)
MCV: 93.7 fL (ref 80.0–100.0)
Monocytes Absolute: 0.5 K/uL (ref 0.1–1.0)
Monocytes Relative: 12 %
Neutro Abs: 2.8 K/uL (ref 1.7–7.7)
Neutrophils Relative %: 63 %
Platelet Count: 220 K/uL (ref 150–400)
RBC: 3.66 MIL/uL — ABNORMAL LOW (ref 3.87–5.11)
RDW: 13.5 % (ref 11.5–15.5)
WBC Count: 4.5 K/uL (ref 4.0–10.5)
nRBC: 0 % (ref 0.0–0.2)

## 2024-04-21 LAB — CMP (CANCER CENTER ONLY)
ALT: 31 U/L (ref 0–44)
AST: 29 U/L (ref 15–41)
Albumin: 4.2 g/dL (ref 3.5–5.0)
Alkaline Phosphatase: 112 U/L (ref 38–126)
Anion gap: 6 (ref 5–15)
BUN: 16 mg/dL (ref 8–23)
CO2: 30 mmol/L (ref 22–32)
Calcium: 9.2 mg/dL (ref 8.9–10.3)
Chloride: 104 mmol/L (ref 98–111)
Creatinine: 0.75 mg/dL (ref 0.44–1.00)
GFR, Estimated: >60 mL/min (ref >60)
Glucose, Bld: 106 mg/dL — ABNORMAL HIGH (ref 70–99)
Potassium: 3.8 mmol/L (ref 3.5–5.1)
Sodium: 140 mmol/L (ref 135–145)
Total Bilirubin: 0.5 mg/dL (ref 0.0–1.2)
Total Protein: 6.4 g/dL — ABNORMAL LOW (ref 6.5–8.1)

## 2024-04-21 MED ORDER — PALONOSETRON HCL INJECTION 0.25 MG/5ML
0.2500 mg | Freq: Once | INTRAVENOUS | Status: AC
  Administered 2024-04-21: 0.25 mg via INTRAVENOUS
  Filled 2024-04-21: qty 5

## 2024-04-21 MED ORDER — DIPHENHYDRAMINE HCL 25 MG PO CAPS
50.0000 mg | ORAL_CAPSULE | Freq: Once | ORAL | Status: AC
  Administered 2024-04-21: 50 mg via ORAL
  Filled 2024-04-21: qty 2

## 2024-04-21 MED ORDER — ACETAMINOPHEN 325 MG PO TABS
650.0000 mg | ORAL_TABLET | Freq: Once | ORAL | Status: AC
  Administered 2024-04-21: 650 mg via ORAL
  Filled 2024-04-21: qty 2

## 2024-04-21 MED ORDER — DEXAMETHASONE SODIUM PHOSPHATE 10 MG/ML IJ SOLN
10.0000 mg | Freq: Once | INTRAMUSCULAR | Status: AC
  Administered 2024-04-21: 10 mg via INTRAVENOUS
  Filled 2024-04-21: qty 1

## 2024-04-21 MED ORDER — SODIUM CHLORIDE 0.9% FLUSH
10.0000 mL | Freq: Once | INTRAVENOUS | Status: AC
  Administered 2024-04-21: 10 mL

## 2024-04-21 MED ORDER — SODIUM CHLORIDE 0.9 % IV SOLN
90.0000 mg/m2 | Freq: Once | INTRAVENOUS | Status: AC
  Administered 2024-04-21: 200 mg via INTRAVENOUS
  Filled 2024-04-21: qty 8

## 2024-04-21 MED ORDER — SODIUM CHLORIDE 0.9 % IV SOLN
375.0000 mg/m2 | Freq: Once | INTRAVENOUS | Status: AC
  Administered 2024-04-21: 800 mg via INTRAVENOUS
  Filled 2024-04-21: qty 50

## 2024-04-21 MED ORDER — SODIUM CHLORIDE 0.9 % IV SOLN: INTRAVENOUS | Status: DC

## 2024-04-21 NOTE — Progress Notes (Signed)
 [facility] [phone]   [phone]  PROGRESS NOTE  Patient Care Team: Katrinka Garnette KIDD, MD as PCP - General (Family Medicine) Cloretta Arley NOVAK, MD as Consulting Physician (Hematology and Oncology) Elana Montie CROME, RN as Nurse Navigator  Hematological/Oncological History # Follicular Lymphoma Stage III, Grade 3a  12/26/2023: right cervical lymph node biopsy showed follicular lymphoma with focal centroblasts approaching 15/HPF suggestive of grade 3A, i.e. high-grade, follicular lymphoma  01/16/2024: establish care with Dr. Federico  01/25/2024: NM PET CT scan showed evidence of active lymphoma within the neck, chest, abdomen, and pelvis. ([place]) 5, as well as mild splenic hypermetabolism  02/25/2024: Cycle 1 Day 1 of R-Benda Chemotherapy 03/24/2024:  Cycle 2 Day 1 of R-Benda Chemotherapy 04/21/2024: Cycle 3 Day 1 of R-Benda Chemotherapy  Interval History:  Tracy Bradley 68 y.o. female with medical history significant for follicular lymphoma, grade 3A, stage III who presents for a follow up visit. The patient's last visit was on 03/24/2024. In the interim since the last visit she has continued R-Benda chemotherapy.   On exam today Tracy Bradley is accompanied by her husband.  She reports her second cycle of rituximab  and Bendamustine  went very well with no major side effects.  She did not have any issues with nausea, vomiting, or diarrhea.  She reports her energy levels are good and reports today they are 10 out of 10.  Her appetite remains strong and she is up 2 pounds from our last visit.  She reports she is doing her best to walk more and hydrate.  Her hair is thinning some but she is not having any other major side effects.  She reports that with this last cycle the fatigue lasted little longer, going into the third week.  She has had no issues with bleeding, bruising, or dark stools.  She denies any runny nose, sore throat, cough.  Overall she is tolerating  chemotherapy treatment well and willing and able to proceed.  A full 10 point ROS is otherwise negative.  MEDICAL HISTORY:  Past Medical History:  Diagnosis Date   Anemia    since childhood   Ileus following gastrointestinal surgery (HCC) 04/22/2012   Muscle strain 04/22/2012    SURGICAL HISTORY: Past Surgical History:  Procedure Laterality Date   BACK SURGERY     1980- low back ruptured disk   IR IMAGING GUIDED PORT INSERTION  02/21/2024   LAPAROSCOPIC APPENDECTOMY  04/14/2012   Procedure: APPENDECTOMY LAPAROSCOPIC;  Surgeon: Alm VEAR Angle, MD;  Location: [facility];  Service: General;  Laterality: N/A;   LYMPH NODE BIOPSY Right 02/06/2024   Procedure: RIGHT NECK CERVICAL LYMPH NODE BIOPSY;  Surgeon: Tobie Eldora NOVAK, MD;  Location: [facility] OR;  Service: ENT;  Laterality: Right;   TONSILLECTOMY     1980s    SOCIAL HISTORY: Social History   Socioeconomic History   Marital status: Married    Spouse name: Not on file   Number of children: Not on file   Years of education: Not on file   Highest education level: Not on file  Occupational History   Not on file  Tobacco Use   Smoking status: Never   Smokeless tobacco: Never  Vaping Use   Vaping status: Never Used  Substance and Sexual Activity   Alcohol use: No   Drug use: No   Sexual activity: Yes    Birth control/protection: Post-menopausal    Comment: husband only  Other Topics Concern   Not on file  Social History Narrative   Married. 3 children- 40, 38, 25 in 2025- 2 sons and a daughter. No grandkids yet.       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 drug abuse (children had gone there)   Social Drivers of Corporate investment banker Strain: Not on file  Food Insecurity: No Food Insecurity (01/16/2024)   Hunger Vital Sign    Worried About Running Out of Food in the Last Year: Never true    Ran Out of Food in the Last Year: Never true   Transportation Needs: No Transportation Needs (01/16/2024)   PRAPARE - Administrator, Civil Service (Medical): No    Lack of Transportation (Non-Medical): No  Physical Activity: Not on file  Stress: Not on file  Social Connections: Not on file  Intimate Partner Violence: Not At Risk (01/16/2024)   Humiliation, Afraid, Rape, and Kick questionnaire    Fear of Current or Ex-Partner: No    Emotionally Abused: No    Physically Abused: No    Sexually Abused: No    FAMILY HISTORY: Family History  Problem Relation Age of Onset   Stroke Mother    Lymphoma Father    Drug abuse Daughter    Depression Daughter    Bipolar disorder Daughter    Breast cancer Maternal Aunt 74 - 37   Breast cancer Paternal Aunt 90 - 22   Stroke Maternal Grandmother    Stroke Paternal Grandfather    Diabetes Brother    Hyperlipidemia Brother    Kidney disease Brother        dehydration and stopped dm meds etc- thankfully had recovery   Depression Brother    Drug abuse Brother    Drug abuse Son    Depression Son    BRCA 1/2 Neg Hx     ALLERGIES:  has no known allergies.  MEDICATIONS:  Current Outpatient Medications  Medication Sig Dispense Refill   acetaminophen  (TYLENOL ) 500 MG tablet Take 2 tablets (1,000 mg total) by mouth every 6 (six) hours as needed. 30 tablet 0   allopurinol  (ZYLOPRIM ) 300 MG tablet Take 1 tablet (300 mg total) by mouth daily. 90 tablet 1   ibuprofen  (ADVIL ) 200 MG tablet Take 2 tablets (400 mg total) by mouth every 6 (six) hours as needed. 30 tablet 0   lidocaine -prilocaine  (EMLA ) cream Apply 1 Application topically as needed. 30 g 0   Multiple Vitamins-Minerals (MULTIVITAMIN WITH MINERALS) tablet Take 1 tablet by mouth daily.     ondansetron  (ZOFRAN ) 8 MG tablet Take 1 tablet (8 mg total) by mouth every 8 (eight) hours as needed. 30 tablet 0   prochlorperazine  (COMPAZINE ) 10 MG tablet TAKE 1 TABLET BY MOUTH EVERY 6 HOURS AS NEEDED FOR NAUSEA OR VOMITING. 30 tablet 0    No current facility-administered medications for this visit.   Facility-Administered Medications Ordered in Other Visits  Medication Dose Route Frequency Provider Last Rate Last Admin   0.9 %  sodium chloride  infusion   Intravenous Continuous Devonn Giampietro T IV, MD   Stopped at 04/21/24 1239    REVIEW OF SYSTEMS:   Constitutional: ( - ) fevers, ( - )  chills , ( - ) night sweats Eyes: ( - ) blurriness of vision, ( - ) double vision, ( - ) watery eyes Ears, nose, mouth, throat, and face: ( - ) mucositis, ( - ) sore throat Respiratory: ( - )  cough, ( - ) dyspnea, ( - ) wheezes Cardiovascular: ( - ) palpitation, ( - ) chest discomfort, ( - ) lower extremity swelling Gastrointestinal:  ( - ) nausea, ( - ) heartburn, ( - ) change in bowel habits Skin: ( - ) abnormal skin rashes Lymphatics: ( - ) new lymphadenopathy, ( - ) easy bruising Neurological: ( - ) numbness, ( - ) tingling, ( - ) new weaknesses Behavioral/Psych: ( - ) mood change, ( - ) new changes  All other systems were reviewed with the patient and are negative.  PHYSICAL EXAMINATION: ECOG PERFORMANCE STATUS: 1 - Symptomatic but completely ambulatory  Vitals:   04/21/24 0855  BP: 110/65  Pulse: 74  Resp: 16  Temp: 97.8 F (36.6 C)  SpO2: 97%      Filed Weights   04/21/24 0855  Weight: 200 lb 14.4 oz (91.1 kg)       GENERAL: alert, no distress and comfortable SKIN: skin color, texture, turgor are normal, no rashes or significant lesions EYES: conjunctiva are pink and non-injected, sclera clear OROPHARYNX: no exudate, no erythema; lips, buccal mucosa, and tongue normal  NECK: supple, non-tender LYMPH:  no palpable lymphadenopathy in the cervical, axillary or inguinal LUNGS: clear to auscultation and percussion with normal breathing effort HEART: regular rate & rhythm and no murmurs and no lower extremity edema ABDOMEN: soft, non-tender, non-distended, normal bowel sounds Musculoskeletal: no cyanosis of digits  and no clubbing  PSYCH: alert & oriented x 3, fluent speech NEURO: no focal motor/sensory deficits  LABORATORY DATA:  I have reviewed the data as listed    Latest Ref Rng & Units 04/21/2024    8:38 AM 03/24/2024   10:15 AM 02/22/2024   11:07 AM  CBC  WBC 4.0 - 10.5 K/uL 4.5  5.0  5.6   Hemoglobin 12.0 - 15.0 g/dL 88.3  88.2  88.0   Hematocrit 36.0 - 46.0 % 34.3  35.2  35.5   Platelets 150 - 400 K/uL 220  217  211        Latest Ref Rng & Units 04/21/2024    8:38 AM 03/24/2024   10:15 AM 02/22/2024   11:07 AM  CMP  Glucose 70 - 99 mg/dL 893  888  897   BUN 8 - 23 mg/dL 16  14  15    Creatinine 0.44 - 1.00 mg/dL 9.24  9.23  9.21   Sodium 135 - 145 mmol/L 140  140  138   Potassium 3.5 - 5.1 mmol/L 3.8  3.9  4.1   Chloride 98 - 111 mmol/L 104  106  104   CO2 22 - 32 mmol/L 30  28  28    Calcium 8.9 - 10.3 mg/dL 9.2  9.1  9.0   Total Protein 6.5 - 8.1 g/dL 6.4  6.9  6.3   Total Bilirubin 0.0 - 1.2 mg/dL 0.5  0.5  0.4   Alkaline Phos 38 - 126 U/L 112  90  77   AST 15 - 41 U/L 29  25  26    ALT 0 - 44 U/L 31  21  21      RADIOGRAPHIC STUDIES: I have personally reviewed the radiological images as listed and agreed with the findings in the report. No results found.   ASSESSMENT & PLAN Tracy Bradley 68 y.o. female with medical history significant for follicular lymphoma, grade 3A, stage III who presents for a follow up visit.  After review of the labs, review of the records,  and discussion with the patient the patients findings are most consistent with high-grade follicular lymphoma, staging in process.   Previously we discussed the nature of follicular lymphoma and steps moving forward.  We discussed that it tends to be a more indolent lymphoma and the treatment may not be required immediately.  We discussed criteria for treatment including bulky disease as well as rapid progression.  Additionally we discussed that her blood counts appear stable at last check, we will recheck these  today.  Pending results that scan we will will determine next best steps moving forward.  The patient voiced understanding of our findings, the nature of the disease, and the plan moving forward.    # Follicular Lymphoma grade 3a, Stage III -- Needle core biopsy confirmed follicular lymphoma, grade 3A (high-grade). excisional biopsy confirmed no transformation to more aggressive lymphoma.  -- PET CT scan confirms stage III disease with involvement on both sides of the diaphragm.  No evidence of organ invasion or bone marrow involvement --port placement scheduled for 02/21/2024.  --gave the patient options for monotherapy rituximab  versus Bendamustine  and rituximab .  Both would be effective treatments.  Given her young age I do believe the combination of chemotherapy and immunotherapy would be the best option, though immunotherapy alone would be reasonable.  Presented these options to the patient today. PLAN: -- will proceed with Bendamustine  and rituximab  q. 28 days x 6 cycles. Today is Cycle 3 Day 1  --labs today show WBC 4.5, hemoglobin 1.6, MCV 93.7, platelets 220 -- Plan for return to clinic for next cycle in 4 weeks time  #Supportive Care -- chemotherapy education complete -- port placement on 02/21/2024.  -- zofran  8mg  q8H PRN and compazine  10mg  PO q6H for nausea -- allopurinol  300mg  PO daily for TLS prophylaxis -- EMLA  cream for port --screened negative for hepatitis B on 02/01/2024.  -- no pain medication required at this time.    No orders of the defined types were placed in this encounter.   All questions were answered. The patient knows to call the clinic with any problems, questions or concerns.  A total of more than 30 minutes were spent on this encounter with face-to-face time and non-face-to-face time, including preparing to see the patient, ordering tests and/or medications, counseling the patient and coordination of care as outlined above.   Norleen IVAR Kidney, MD Department of  Hematology/Oncology [facility] at [facility] Phone: [phone] Pager: [phone] Email: [email or web address]  04/21/2024 5:44 PM

## 2024-04-21 NOTE — Patient Instructions (Signed)
 [facility] - A DEPT OF [facility]  Discharge Instructions: Thank you for choosing [facility] to provide your oncology and hematology care.   If you have a lab appointment with the Cancer Center, please go directly to the Cancer Center and check in at the registration area.   Wear comfortable clothing and clothing appropriate for easy access to any Portacath or PICC line.   We strive to give you quality time with your provider. You may need to reschedule your appointment if you arrive late (15 or more minutes).  Arriving late affects you and other patients whose appointments are after yours.  Also, if you miss three or more appointments without notifying the office, you may be dismissed from the clinic at the provider's discretion.      For prescription refill requests, have your pharmacy contact our office and allow 72 hours for refills to be completed.    Today you received the following chemotherapy and/or immunotherapy agents: Bendeka      To help prevent nausea and vomiting after your treatment, we encourage you to take your nausea medication as directed.  BELOW ARE SYMPTOMS THAT SHOULD BE REPORTED IMMEDIATELY: *FEVER GREATER THAN 100.4 F (38 C) OR HIGHER *CHILLS OR SWEATING *NAUSEA AND VOMITING THAT IS NOT CONTROLLED WITH YOUR NAUSEA MEDICATION *UNUSUAL SHORTNESS OF BREATH *UNUSUAL BRUISING OR BLEEDING *URINARY PROBLEMS (pain or burning when urinating, or frequent urination) *BOWEL PROBLEMS (unusual diarrhea, constipation, pain near the anus) TENDERNESS IN MOUTH AND THROAT WITH OR WITHOUT PRESENCE OF ULCERS (sore throat, sores in mouth, or a toothache) UNUSUAL RASH, SWELLING OR PAIN  UNUSUAL VAGINAL DISCHARGE OR ITCHING   Items with * indicate a potential emergency and should be followed up as soon as possible or go to the Emergency Department if any problems should occur.  Please show the CHEMOTHERAPY ALERT CARD or IMMUNOTHERAPY  ALERT CARD at check-in to the Emergency Department and triage nurse.  Should you have questions after your visit or need to cancel or reschedule your appointment, please contact [facility]  Dept: [phone]  and follow the prompts.  Office hours are 8:00 a.m. to 4:30 p.m. Monday - Friday. Please note that voicemails left after 4:00 p.m. may not be returned until the following business day.  We are closed weekends and major holidays. You have access to a nurse at all times for urgent questions. Please call the main number to the clinic Dept: [phone] and follow the prompts.   For any non-urgent questions, you may also contact your provider using MyChart. We now offer e-Visits for anyone 73 and older to request care online for non-urgent symptoms. For details visit [email or web address].   Also download the MyChart app! Go to the app store, search "MyChart", open the app, select [place], and log in with your MyChart username and password.

## 2024-04-22 ENCOUNTER — Ambulatory Visit

## 2024-04-22 ENCOUNTER — Inpatient Hospital Stay

## 2024-04-22 VITALS — BP 120/75 | HR 77 | Temp 97.7°F | Resp 16

## 2024-04-22 DIAGNOSIS — C823A Follicular lymphoma grade IIIA, in remission: Secondary | ICD-10-CM | POA: Diagnosis not present

## 2024-04-22 DIAGNOSIS — C8238 Follicular lymphoma grade IIIa, lymph nodes of multiple sites: Secondary | ICD-10-CM

## 2024-04-22 MED ORDER — SODIUM CHLORIDE 0.9 % IV SOLN
90.0000 mg/m2 | Freq: Once | INTRAVENOUS | Status: AC
  Administered 2024-04-22: 200 mg via INTRAVENOUS
  Filled 2024-04-22: qty 8

## 2024-04-22 MED ORDER — SODIUM CHLORIDE 0.9 % IV SOLN: INTRAVENOUS | Status: DC

## 2024-04-22 MED ORDER — DEXAMETHASONE SODIUM PHOSPHATE 10 MG/ML IJ SOLN
10.0000 mg | Freq: Once | INTRAMUSCULAR | Status: AC
  Administered 2024-04-22: 10 mg via INTRAVENOUS
  Filled 2024-04-22: qty 1

## 2024-04-22 NOTE — Patient Instructions (Signed)
 [facility] - A DEPT OF [facility]  Discharge Instructions: Thank you for choosing [facility] to provide your oncology and hematology care.   If you have a lab appointment with the Cancer Center, please go directly to the Cancer Center and check in at the registration area.   Wear comfortable clothing and clothing appropriate for easy access to any Portacath or PICC line.   We strive to give you quality time with your provider. You may need to reschedule your appointment if you arrive late (15 or more minutes).  Arriving late affects you and other patients whose appointments are after yours.  Also, if you miss three or more appointments without notifying the office, you may be dismissed from the clinic at the provider's discretion.      For prescription refill requests, have your pharmacy contact our office and allow 72 hours for refills to be completed.    Today you received the following chemotherapy and/or immunotherapy agents: Bendeka      To help prevent nausea and vomiting after your treatment, we encourage you to take your nausea medication as directed.  BELOW ARE SYMPTOMS THAT SHOULD BE REPORTED IMMEDIATELY: *FEVER GREATER THAN 100.4 F (38 C) OR HIGHER *CHILLS OR SWEATING *NAUSEA AND VOMITING THAT IS NOT CONTROLLED WITH YOUR NAUSEA MEDICATION *UNUSUAL SHORTNESS OF BREATH *UNUSUAL BRUISING OR BLEEDING *URINARY PROBLEMS (pain or burning when urinating, or frequent urination) *BOWEL PROBLEMS (unusual diarrhea, constipation, pain near the anus) TENDERNESS IN MOUTH AND THROAT WITH OR WITHOUT PRESENCE OF ULCERS (sore throat, sores in mouth, or a toothache) UNUSUAL RASH, SWELLING OR PAIN  UNUSUAL VAGINAL DISCHARGE OR ITCHING   Items with * indicate a potential emergency and should be followed up as soon as possible or go to the Emergency Department if any problems should occur.  Please show the CHEMOTHERAPY ALERT CARD or IMMUNOTHERAPY  ALERT CARD at check-in to the Emergency Department and triage nurse.  Should you have questions after your visit or need to cancel or reschedule your appointment, please contact [facility]  Dept: [phone]  and follow the prompts.  Office hours are 8:00 a.m. to 4:30 p.m. Monday - Friday. Please note that voicemails left after 4:00 p.m. may not be returned until the following business day.  We are closed weekends and major holidays. You have access to a nurse at all times for urgent questions. Please call the main number to the clinic Dept: [phone] and follow the prompts.   For any non-urgent questions, you may also contact your provider using MyChart. We now offer e-Visits for anyone 73 and older to request care online for non-urgent symptoms. For details visit [email or web address].   Also download the MyChart app! Go to the app store, search "MyChart", open the app, select [place], and log in with your MyChart username and password.

## 2024-05-04 ENCOUNTER — Other Ambulatory Visit: Payer: Self-pay | Admitting: Hematology and Oncology

## 2024-05-13 NOTE — Progress Notes (Signed)
 Follow up appointment with Dr Federico 04/21/24 and cycle 3 of chemotherapy. Will continue with chemotherapy. Navigation will continue to follow for needs.

## 2024-05-19 ENCOUNTER — Encounter: Payer: Self-pay | Admitting: Hematology and Oncology

## 2024-05-20 ENCOUNTER — Inpatient Hospital Stay: Payer: Self-pay

## 2024-05-20 ENCOUNTER — Inpatient Hospital Stay

## 2024-05-20 ENCOUNTER — Inpatient Hospital Stay: Payer: Self-pay | Attending: Hematology and Oncology | Admitting: Physician Assistant

## 2024-05-20 VITALS — BP 127/79 | HR 77 | Temp 97.1°F | Resp 14 | Wt 198.8 lb

## 2024-05-20 DIAGNOSIS — Z79899 Other long term (current) drug therapy: Secondary | ICD-10-CM | POA: Diagnosis not present

## 2024-05-20 DIAGNOSIS — C823A Follicular lymphoma grade IIIA, in remission: Secondary | ICD-10-CM | POA: Diagnosis present

## 2024-05-20 DIAGNOSIS — C8238 Follicular lymphoma grade IIIa, lymph nodes of multiple sites: Secondary | ICD-10-CM | POA: Diagnosis not present

## 2024-05-20 DIAGNOSIS — G4730 Sleep apnea, unspecified: Secondary | ICD-10-CM | POA: Insufficient documentation

## 2024-05-20 DIAGNOSIS — D649 Anemia, unspecified: Secondary | ICD-10-CM | POA: Diagnosis not present

## 2024-05-20 DIAGNOSIS — Z803 Family history of malignant neoplasm of breast: Secondary | ICD-10-CM | POA: Insufficient documentation

## 2024-05-20 DIAGNOSIS — Z9221 Personal history of antineoplastic chemotherapy: Secondary | ICD-10-CM | POA: Insufficient documentation

## 2024-05-20 DIAGNOSIS — Z5112 Encounter for antineoplastic immunotherapy: Secondary | ICD-10-CM | POA: Insufficient documentation

## 2024-05-20 DIAGNOSIS — Z806 Family history of leukemia: Secondary | ICD-10-CM | POA: Diagnosis not present

## 2024-05-20 LAB — CMP (CANCER CENTER ONLY)
ALT: 38 U/L (ref 0–44)
AST: 35 U/L (ref 15–41)
Albumin: 4.2 g/dL (ref 3.5–5.0)
Alkaline Phosphatase: 153 U/L — ABNORMAL HIGH (ref 38–126)
Anion gap: 7 (ref 5–15)
BUN: 13 mg/dL (ref 8–23)
CO2: 30 mmol/L (ref 22–32)
Calcium: 9.5 mg/dL (ref 8.9–10.3)
Chloride: 103 mmol/L (ref 98–111)
Creatinine: 0.8 mg/dL (ref 0.44–1.00)
GFR, Estimated: >60 mL/min (ref >60)
Glucose, Bld: 115 mg/dL — ABNORMAL HIGH (ref 70–99)
Potassium: 3.9 mmol/L (ref 3.5–5.1)
Sodium: 140 mmol/L (ref 135–145)
Total Bilirubin: 0.4 mg/dL (ref 0.0–1.2)
Total Protein: 7 g/dL (ref 6.5–8.1)

## 2024-05-20 LAB — CBC WITH DIFFERENTIAL (CANCER CENTER ONLY)
Abs Immature Granulocytes: 0.01 K/uL (ref 0.00–0.07)
Basophils Absolute: 0.1 K/uL (ref 0.0–0.1)
Basophils Relative: 2 %
Eosinophils Absolute: 0.6 K/uL — ABNORMAL HIGH (ref 0.0–0.5)
Eosinophils Relative: 12 %
HCT: 36.6 % (ref 36.0–46.0)
Hemoglobin: 12.7 g/dL (ref 12.0–15.0)
Immature Granulocytes: 0 %
Lymphocytes Relative: 5 %
Lymphs Abs: 0.3 K/uL — ABNORMAL LOW (ref 0.7–4.0)
MCH: 32.2 pg (ref 26.0–34.0)
MCHC: 34.7 g/dL (ref 30.0–36.0)
MCV: 92.9 fL (ref 80.0–100.0)
Monocytes Absolute: 0.6 K/uL (ref 0.1–1.0)
Monocytes Relative: 11 %
Neutro Abs: 3.8 K/uL (ref 1.7–7.7)
Neutrophils Relative %: 70 %
Platelet Count: 231 K/uL (ref 150–400)
RBC: 3.94 MIL/uL (ref 3.87–5.11)
RDW: 13.5 % (ref 11.5–15.5)
WBC Count: 5.4 K/uL (ref 4.0–10.5)
nRBC: 0 % (ref 0.0–0.2)

## 2024-05-20 MED ORDER — SODIUM CHLORIDE 0.9 % IV SOLN
90.0000 mg/m2 | Freq: Once | INTRAVENOUS | Status: AC
  Administered 2024-05-20: 200 mg via INTRAVENOUS
  Filled 2024-05-20: qty 8

## 2024-05-20 MED ORDER — SODIUM CHLORIDE 0.9% FLUSH: 10.0000 mL | INTRAVENOUS | Status: DC | PRN

## 2024-05-20 MED ORDER — SODIUM CHLORIDE 0.9 % IV SOLN
375.0000 mg/m2 | Freq: Once | INTRAVENOUS | Status: AC
  Administered 2024-05-20: 800 mg via INTRAVENOUS
  Filled 2024-05-20: qty 50

## 2024-05-20 MED ORDER — PALONOSETRON HCL INJECTION 0.25 MG/5ML
0.2500 mg | Freq: Once | INTRAVENOUS | Status: AC
  Administered 2024-05-20: 0.25 mg via INTRAVENOUS
  Filled 2024-05-20: qty 5

## 2024-05-20 MED ORDER — SODIUM CHLORIDE 0.9 % IV SOLN: INTRAVENOUS | Status: DC

## 2024-05-20 MED ORDER — DIPHENHYDRAMINE HCL 25 MG PO CAPS
50.0000 mg | ORAL_CAPSULE | Freq: Once | ORAL | Status: AC
  Administered 2024-05-20: 50 mg via ORAL
  Filled 2024-05-20: qty 2

## 2024-05-20 MED ORDER — ACETAMINOPHEN 325 MG PO TABS
650.0000 mg | ORAL_TABLET | Freq: Once | ORAL | Status: AC
  Administered 2024-05-20: 650 mg via ORAL
  Filled 2024-05-20: qty 2

## 2024-05-20 MED ORDER — DEXAMETHASONE SODIUM PHOSPHATE 10 MG/ML IJ SOLN
10.0000 mg | Freq: Once | INTRAMUSCULAR | Status: AC
  Administered 2024-05-20: 10 mg via INTRAVENOUS
  Filled 2024-05-20: qty 1

## 2024-05-20 NOTE — Patient Instructions (Signed)
 [facility] - A DEPT OF [facility]  Discharge Instructions: Thank you for choosing [facility] to provide your oncology and hematology care.   If you have a lab appointment with the Cancer Center, please go directly to the Cancer Center and check in at the registration area.   Wear comfortable clothing and clothing appropriate for easy access to any Portacath or PICC line.   We strive to give you quality time with your provider. You may need to reschedule your appointment if you arrive late (15 or more minutes).  Arriving late affects you and other patients whose appointments are after yours.  Also, if you miss three or more appointments without notifying the office, you may be dismissed from the clinic at the provider's discretion.      For prescription refill requests, have your pharmacy contact our office and allow 72 hours for refills to be completed.    Today you received the following chemotherapy and/or immunotherapy agents: rituximab and bendeka      To help prevent nausea and vomiting after your treatment, we encourage you to take your nausea medication as directed.  BELOW ARE SYMPTOMS THAT SHOULD BE REPORTED IMMEDIATELY: *FEVER GREATER THAN 100.4 F (38 C) OR HIGHER *CHILLS OR SWEATING *NAUSEA AND VOMITING THAT IS NOT CONTROLLED WITH YOUR NAUSEA MEDICATION *UNUSUAL SHORTNESS OF BREATH *UNUSUAL BRUISING OR BLEEDING *URINARY PROBLEMS (pain or burning when urinating, or frequent urination) *BOWEL PROBLEMS (unusual diarrhea, constipation, pain near the anus) TENDERNESS IN MOUTH AND THROAT WITH OR WITHOUT PRESENCE OF ULCERS (sore throat, sores in mouth, or a toothache) UNUSUAL RASH, SWELLING OR PAIN  UNUSUAL VAGINAL DISCHARGE OR ITCHING   Items with * indicate a potential emergency and should be followed up as soon as possible or go to the Emergency Department if any problems should occur.  Please show the CHEMOTHERAPY ALERT CARD or  IMMUNOTHERAPY ALERT CARD at check-in to the Emergency Department and triage nurse.  Should you have questions after your visit or need to cancel or reschedule your appointment, please contact [facility] - A DEPT [facility]  Dept: [phone]  and follow the prompts.  Office hours are 8:00 a.m. to 4:30 p.m. Monday - Friday. Please note that voicemails left after 4:00 p.m. may not be returned until the following business day.  We are closed weekends and major holidays. You have access to a nurse at all times for urgent questions. Please call the main number to the clinic Dept: [phone] and follow the prompts.   For any non-urgent questions, you may also contact your provider using MyChart. We now offer e-Visits for anyone 98 and older to request care online for non-urgent symptoms. For details visit [email or web address].   Also download the MyChart app! Go to the app store, search "MyChart", open the app, select [place], and log in with your MyChart username and password.

## 2024-05-20 NOTE — Progress Notes (Signed)
 [facility] [phone]   [phone]  PROGRESS NOTE  Patient Care Team: Katrinka Garnette KIDD, MD as PCP - General (Family Medicine) Cloretta Arley NOVAK, MD as Consulting Physician (Hematology and Oncology) Elana Montie CROME, RN as Nurse Navigator  Hematological/Oncological History # Follicular Lymphoma Stage III, Grade 3a  12/26/2023: right cervical lymph node biopsy showed follicular lymphoma with focal centroblasts approaching 15/HPF suggestive of grade 3A, i.e. high-grade, follicular lymphoma  01/16/2024: establish care with Dr. Federico  01/25/2024: NM PET CT scan showed evidence of active lymphoma within the neck, chest, abdomen, and pelvis. ([place]) 5, as well as mild splenic hypermetabolism  02/25/2024: Cycle 1 Day 1 of R-Benda Chemotherapy 03/24/2024:  Cycle 2 Day 1 of R-Benda Chemotherapy 04/21/2024: Cycle 3 Day 1 of R-Benda Chemotherapy 05/20/2024: Cycle 4 Day 1 of R-Benda Chemotherapy  Interval History:  Tracy Bradley 68 y.o. female with medical history significant for follicular lymphoma, grade 3A, stage III who presents for a follow up visit. The patient's last visit was on 04/21/2024. In the interim since the last visit she has continued R-Benda chemotherapy.   On exam today Tracy Bradley is accompanied by her husband.  She reports she is doing well without any new or concerning symptoms.  Her energy and appetite are stable.  She can complete all her daily activities on her own.  She denies nausea, vomiting or bowel habit changes.  She does struggle with some insomnia.  She denies easy bruising or signs of overt bleeding such as hematochezia or melena.  She denies fevers, chills, night sweats, shortness of breath, chest pain or cough.  Overall she is tolerating chemotherapy treatment well and willing and able to proceed.  A full 10 point ROS is otherwise negative.  MEDICAL HISTORY:  Past Medical History:  Diagnosis Date   Anemia    since childhood    Ileus following gastrointestinal surgery (HCC) 04/22/2012   Muscle strain 04/22/2012    SURGICAL HISTORY: Past Surgical History:  Procedure Laterality Date   BACK SURGERY     1980- low back ruptured disk   IR IMAGING GUIDED PORT INSERTION  02/21/2024   LAPAROSCOPIC APPENDECTOMY  04/14/2012   Procedure: APPENDECTOMY LAPAROSCOPIC;  Surgeon: Alm VEAR Angle, MD;  Location: [facility];  Service: General;  Laterality: N/A;   LYMPH NODE BIOPSY Right 02/06/2024   Procedure: RIGHT NECK CERVICAL LYMPH NODE BIOPSY;  Surgeon: Tobie Eldora NOVAK, MD;  Location: [facility] OR;  Service: ENT;  Laterality: Right;   TONSILLECTOMY     1980s    SOCIAL HISTORY: Social History   Socioeconomic History   Marital status: Married    Spouse name: Not on file   Number of children: Not on file   Years of education: Not on file   Highest education level: Not on file  Occupational History   Not on file  Tobacco Use   Smoking status: Never   Smokeless tobacco: Never  Vaping Use   Vaping status: Never Used  Substance and Sexual Activity   Alcohol use: No   Drug use: No   Sexual activity: Yes    Birth control/protection: Post-menopausal    Comment: husband only  Other Topics Concern   Not on file  Social History Narrative   Married. 3 children- 40, 38, 25 in 2025- 2 sons and a daughter. No grandkids yet.       Retired- Manufacturing systems engineer previously      Hobbies: volunteering - Theatre stage manager- rehab for  alcohol and drug abuse (children had gone there)   Social Drivers of Corporate investment banker Strain: Not on file  Food Insecurity: No Food Insecurity (01/16/2024)   Hunger Vital Sign    Worried About Running Out of Food in the Last Year: Never true    Ran Out of Food in the Last Year: Never true  Transportation Needs: No Transportation Needs (01/16/2024)   PRAPARE - Administrator, Civil Service (Medical): No    Lack of Transportation (Non-Medical): No  Physical Activity: Not on  file  Stress: Not on file  Social Connections: Not on file  Intimate Partner Violence: Not At Risk (01/16/2024)   Humiliation, Afraid, Rape, and Kick questionnaire    Fear of Current or Ex-Partner: No    Emotionally Abused: No    Physically Abused: No    Sexually Abused: No    FAMILY HISTORY: Family History  Problem Relation Age of Onset   Stroke Mother    Lymphoma Father    Drug abuse Daughter    Depression Daughter    Bipolar disorder Daughter    Breast cancer Maternal Aunt 30 - 77   Breast cancer Paternal Aunt 16 - 18   Stroke Maternal Grandmother    Stroke Paternal Grandfather    Diabetes Brother    Hyperlipidemia Brother    Kidney disease Brother        dehydration and stopped dm meds etc- thankfully had recovery   Depression Brother    Drug abuse Brother    Drug abuse Son    Depression Son    BRCA 1/2 Neg Hx     ALLERGIES:  has no known allergies.  MEDICATIONS:  Current Outpatient Medications  Medication Sig Dispense Refill   acetaminophen  (TYLENOL ) 500 MG tablet Take 2 tablets (1,000 mg total) by mouth every 6 (six) hours as needed. 30 tablet 0   allopurinol  (ZYLOPRIM ) 300 MG tablet Take 1 tablet (300 mg total) by mouth daily. 90 tablet 1   ibuprofen  (ADVIL ) 200 MG tablet Take 2 tablets (400 mg total) by mouth every 6 (six) hours as needed. 30 tablet 0   lidocaine -prilocaine  (EMLA ) cream Apply 1 Application topically as needed. 30 g 0   Multiple Vitamins-Minerals (MULTIVITAMIN WITH MINERALS) tablet Take 1 tablet by mouth daily.     ondansetron  (ZOFRAN ) 8 MG tablet Take 1 tablet (8 mg total) by mouth every 8 (eight) hours as needed. 30 tablet 0   prochlorperazine  (COMPAZINE ) 10 MG tablet TAKE 1 TABLET BY MOUTH EVERY 6 HOURS AS NEEDED FOR NAUSEA OR VOMITING. 30 tablet 0   No current facility-administered medications for this visit.   Facility-Administered Medications Ordered in Other Visits  Medication Dose Route Frequency Provider Last Rate Last Admin   0.9 %   sodium chloride  infusion   Intravenous Continuous Federico Norleen DASEN IV, MD 10 mL/hr at 05/20/24 1209 New Bag at 05/20/24 1209   acetaminophen  (TYLENOL ) tablet 650 mg  650 mg Oral Once Dorsey, John T IV, MD       bendamustine  (BENDEKA ) 200 mg in sodium chloride  0.9 % 50 mL (3.4483 mg/mL) chemo infusion  90 mg/m2 (Treatment Plan Recorded) Intravenous Once Dorsey, John T IV, MD       dexamethasone  (DECADRON ) injection 10 mg  10 mg Intravenous Once Dorsey, John T IV, MD       diphenhydrAMINE  (BENADRYL ) capsule 50 mg  50 mg Oral Once Dorsey, John T IV, MD  palonosetron  (ALOXI ) injection 0.25 mg  0.25 mg Intravenous Once Dorsey, John T IV, MD       riTUXimab -pvvr (RUXIENCE ) 800 mg in sodium chloride  0.9 % 170 mL infusion  375 mg/m2 (Treatment Plan Recorded) Intravenous Once Dorsey, John T IV, MD       sodium chloride  flush (NS) 0.9 % injection 10 mL  10 mL Intracatheter PRN Federico Norleen ONEIDA MADISON, MD        REVIEW OF SYSTEMS:   Constitutional: ( - ) fevers, ( - )  chills , ( - ) night sweats Eyes: ( - ) blurriness of vision, ( - ) double vision, ( - ) watery eyes Ears, nose, mouth, throat, and face: ( - ) mucositis, ( - ) sore throat Respiratory: ( - ) cough, ( - ) dyspnea, ( - ) wheezes Cardiovascular: ( - ) palpitation, ( - ) chest discomfort, ( - ) lower extremity swelling Gastrointestinal:  ( - ) nausea, ( - ) heartburn, ( - ) change in bowel habits Skin: ( - ) abnormal skin rashes Lymphatics: ( - ) new lymphadenopathy, ( - ) easy bruising Neurological: ( - ) numbness, ( - ) tingling, ( - ) new weaknesses Behavioral/Psych: ( - ) mood change, ( - ) new changes  All other systems were reviewed with the patient and are negative.  PHYSICAL EXAMINATION: ECOG PERFORMANCE STATUS: 1 - Symptomatic but completely ambulatory  Vitals:   05/20/24 1144  BP: 127/79  Pulse: 77  Resp: 14  Temp: (!) 97.1 F (36.2 C)  SpO2: 100%      Filed Weights   05/20/24 1144  Weight: 198 lb 12.8 oz (90.2 kg)     GENERAL: alert, no distress and comfortable SKIN: skin color, texture, turgor are normal, no rashes or significant lesions EYES: conjunctiva are pink and non-injected, sclera clear LUNGS: clear to auscultation and percussion with normal breathing effort HEART: regular rate & rhythm and no murmurs and no lower extremity edema Musculoskeletal: no cyanosis of digits and no clubbing  PSYCH: alert & oriented x 3, fluent speech NEURO: no focal motor/sensory deficits  LABORATORY DATA:  I have reviewed the data as listed    Latest Ref Rng & Units 05/20/2024   10:58 AM 04/21/2024    8:38 AM 03/24/2024   10:15 AM  CBC  WBC 4.0 - 10.5 K/uL 5.4  4.5  5.0   Hemoglobin 12.0 - 15.0 g/dL 87.2  88.3  88.2   Hematocrit 36.0 - 46.0 % 36.6  34.3  35.2   Platelets 150 - 400 K/uL 231  220  217        Latest Ref Rng & Units 05/20/2024   10:58 AM 04/21/2024    8:38 AM 03/24/2024   10:15 AM  CMP  Glucose 70 - 99 mg/dL 884  893  888   BUN 8 - 23 mg/dL 13  16  14    Creatinine 0.44 - 1.00 mg/dL 9.19  9.24  9.23   Sodium 135 - 145 mmol/L 140  140  140   Potassium 3.5 - 5.1 mmol/L 3.9  3.8  3.9   Chloride 98 - 111 mmol/L 103  104  106   CO2 22 - 32 mmol/L 30  30  28    Calcium 8.9 - 10.3 mg/dL 9.5  9.2  9.1   Total Protein 6.5 - 8.1 g/dL 7.0  6.4  6.9   Total Bilirubin 0.0 - 1.2 mg/dL 0.4  0.5  0.5  Alkaline Phos 38 - 126 U/L 153  112  90   AST 15 - 41 U/L 35  29  25   ALT 0 - 44 U/L 38  31  21     RADIOGRAPHIC STUDIES: I have personally reviewed the radiological images as listed and agreed with the findings in the report. No results found.   ASSESSMENT & PLAN Clotine Heiner 68 y.o. female with medical history significant for follicular lymphoma, grade 3A, stage III who presents for a follow up visit.  After review of the labs, review of the records, and discussion with the patient the patients findings are most consistent with high-grade follicular lymphoma, staging in process.    Previously we discussed the nature of follicular lymphoma and steps moving forward.  We discussed that it tends to be a more indolent lymphoma and the treatment may not be required immediately.  We discussed criteria for treatment including bulky disease as well as rapid progression.  Additionally we discussed that her blood counts appear stable at last check, we will recheck these today.  Pending results that scan we will will determine next best steps moving forward.  The patient voiced understanding of our findings, the nature of the disease, and the plan moving forward.    # Follicular Lymphoma grade 3a, Stage III -- Needle core biopsy confirmed follicular lymphoma, grade 3A (high-grade). excisional biopsy confirmed no transformation to more aggressive lymphoma.  -- PET CT scan confirms stage III disease with involvement on both sides of the diaphragm.  No evidence of organ invasion or bone marrow involvement --port placement scheduled for 02/21/2024.  --Recommend Bendamustine  and rituximab  q. 28 days x 6 cycles. Started Cycle 1, Day 1 of 02/25/2024. PLAN: --Due for Cycle 4, Day 1 today --labs today show WBC 5.4, hemoglobin 12.7, platelet 231, creatinine and LFTs in range. --Proceed with treatment today without any dose modifications. --RTC in 4 weeks with labs and follow up prior to Cycle 5, Day 1.   #Supportive Care -- chemotherapy education complete -- port placement on 02/21/2024.  -- zofran  8mg  q8H PRN and compazine  10mg  PO q6H for nausea -- allopurinol  300mg  PO daily for TLS prophylaxis -- EMLA  cream for port --screened negative for hepatitis B on 02/01/2024.  -- no pain medication required at this time.    Orders Placed This Encounter  Procedures   CBC with Differential ([facility])    Standing Status:   Future    Expected Date:   06/17/2024    Expiration Date:   06/17/2025   CMP ([facility])    Standing Status:   Future    Expected Date:   06/17/2024     Expiration Date:   06/17/2025   CBC with Differential ([facility])    Standing Status:   Future    Expected Date:   07/15/2024    Expiration Date:   07/15/2025   CMP ([facility])    Standing Status:   Future    Expected Date:   07/15/2024    Expiration Date:   07/15/2025    All questions were answered. The patient knows to call the clinic with any problems, questions or concerns.   I have spent a total of 30 minutes minutes of face-to-face and non-face-to-face time, preparing to see the patient, performing a medically appropriate examination, counseling and educating the patient, ordering medications/tests/procedures, documenting clinical information in the electronic health record, independently interpreting results and communicating results to the patient,  and care coordination.   Johnston Police PA-C Dept of Hematology and Oncology [facility] Phone: [phone]   05/20/2024 12:09 PM

## 2024-05-21 ENCOUNTER — Inpatient Hospital Stay: Payer: Self-pay

## 2024-05-21 ENCOUNTER — Other Ambulatory Visit: Payer: Self-pay | Admitting: Hematology and Oncology

## 2024-05-21 ENCOUNTER — Inpatient Hospital Stay

## 2024-05-21 VITALS — BP 102/71 | HR 66 | Temp 98.6°F | Resp 18 | Ht 68.0 in | Wt 200.8 lb

## 2024-05-21 DIAGNOSIS — C823A Follicular lymphoma grade IIIA, in remission: Secondary | ICD-10-CM | POA: Diagnosis not present

## 2024-05-21 DIAGNOSIS — C8238 Follicular lymphoma grade IIIa, lymph nodes of multiple sites: Secondary | ICD-10-CM

## 2024-05-21 MED ORDER — SODIUM CHLORIDE 0.9 % IV SOLN: INTRAVENOUS | Status: DC

## 2024-05-21 MED ORDER — SODIUM CHLORIDE 0.9 % IV SOLN
90.0000 mg/m2 | Freq: Once | INTRAVENOUS | Status: AC
  Administered 2024-05-21: 200 mg via INTRAVENOUS
  Filled 2024-05-21: qty 8

## 2024-05-21 MED ORDER — DEXAMETHASONE SODIUM PHOSPHATE 10 MG/ML IJ SOLN
10.0000 mg | Freq: Once | INTRAMUSCULAR | Status: AC
  Administered 2024-05-21: 10 mg via INTRAVENOUS
  Filled 2024-05-21: qty 1

## 2024-05-22 ENCOUNTER — Other Ambulatory Visit: Payer: Self-pay

## 2024-06-10 ENCOUNTER — Ambulatory Visit

## 2024-06-10 ENCOUNTER — Ambulatory Visit (INDEPENDENT_AMBULATORY_CARE_PROVIDER_SITE_OTHER)

## 2024-06-10 DIAGNOSIS — Z23 Encounter for immunization: Secondary | ICD-10-CM

## 2024-06-10 NOTE — Progress Notes (Signed)
 Patient is in office today for a nurse visit for Immunization. Patient Injection was given in the  Left deltoid. Patient tolerated injection well.

## 2024-06-17 ENCOUNTER — Inpatient Hospital Stay: Attending: Hematology and Oncology | Admitting: Hematology and Oncology

## 2024-06-17 ENCOUNTER — Inpatient Hospital Stay

## 2024-06-17 VITALS — BP 120/73 | HR 68 | Temp 98.4°F | Resp 15

## 2024-06-17 VITALS — BP 140/90 | HR 79 | Temp 98.2°F | Resp 15 | Wt 206.0 lb

## 2024-06-17 DIAGNOSIS — C8238 Follicular lymphoma grade IIIa, lymph nodes of multiple sites: Secondary | ICD-10-CM

## 2024-06-17 DIAGNOSIS — Z5111 Encounter for antineoplastic chemotherapy: Secondary | ICD-10-CM | POA: Insufficient documentation

## 2024-06-17 DIAGNOSIS — R197 Diarrhea, unspecified: Secondary | ICD-10-CM | POA: Insufficient documentation

## 2024-06-17 DIAGNOSIS — D649 Anemia, unspecified: Secondary | ICD-10-CM | POA: Diagnosis not present

## 2024-06-17 DIAGNOSIS — Z803 Family history of malignant neoplasm of breast: Secondary | ICD-10-CM | POA: Insufficient documentation

## 2024-06-17 DIAGNOSIS — Z95828 Presence of other vascular implants and grafts: Secondary | ICD-10-CM | POA: Diagnosis not present

## 2024-06-17 DIAGNOSIS — C8230 Follicular lymphoma grade IIIa, unspecified site: Secondary | ICD-10-CM | POA: Diagnosis present

## 2024-06-17 DIAGNOSIS — R5383 Other fatigue: Secondary | ICD-10-CM | POA: Diagnosis not present

## 2024-06-17 DIAGNOSIS — Z79899 Other long term (current) drug therapy: Secondary | ICD-10-CM | POA: Diagnosis not present

## 2024-06-17 DIAGNOSIS — Z806 Family history of leukemia: Secondary | ICD-10-CM | POA: Insufficient documentation

## 2024-06-17 LAB — CMP (CANCER CENTER ONLY)
ALT: 25 U/L (ref 0–44)
AST: 29 U/L (ref 15–41)
Albumin: 4 g/dL (ref 3.5–5.0)
Alkaline Phosphatase: 117 U/L (ref 38–126)
Anion gap: 6 (ref 5–15)
BUN: 14 mg/dL (ref 8–23)
CO2: 28 mmol/L (ref 22–32)
Calcium: 9.5 mg/dL (ref 8.9–10.3)
Chloride: 107 mmol/L (ref 98–111)
Creatinine: 0.64 mg/dL (ref 0.44–1.00)
GFR, Estimated: >60 mL/min (ref >60)
Glucose, Bld: 110 mg/dL — ABNORMAL HIGH (ref 70–99)
Potassium: 3.8 mmol/L (ref 3.5–5.1)
Sodium: 141 mmol/L (ref 135–145)
Total Bilirubin: 0.3 mg/dL (ref 0.0–1.2)
Total Protein: 6.5 g/dL (ref 6.5–8.1)

## 2024-06-17 LAB — CBC WITH DIFFERENTIAL (CANCER CENTER ONLY)
Abs Immature Granulocytes: 0.02 K/uL (ref 0.00–0.07)
Basophils Absolute: 0.1 K/uL (ref 0.0–0.1)
Basophils Relative: 2 %
Eosinophils Absolute: 0.4 K/uL (ref 0.0–0.5)
Eosinophils Relative: 9 %
HCT: 34.2 % — ABNORMAL LOW (ref 36.0–46.0)
Hemoglobin: 11.6 g/dL — ABNORMAL LOW (ref 12.0–15.0)
Immature Granulocytes: 0 %
Lymphocytes Relative: 6 %
Lymphs Abs: 0.3 K/uL — ABNORMAL LOW (ref 0.7–4.0)
MCH: 32.2 pg (ref 26.0–34.0)
MCHC: 33.9 g/dL (ref 30.0–36.0)
MCV: 95 fL (ref 80.0–100.0)
Monocytes Absolute: 0.6 K/uL (ref 0.1–1.0)
Monocytes Relative: 11 %
Neutro Abs: 3.6 K/uL (ref 1.7–7.7)
Neutrophils Relative %: 72 %
Platelet Count: 249 K/uL (ref 150–400)
RBC: 3.6 MIL/uL — ABNORMAL LOW (ref 3.87–5.11)
RDW: 13.4 % (ref 11.5–15.5)
WBC Count: 4.9 K/uL (ref 4.0–10.5)
nRBC: 0 % (ref 0.0–0.2)

## 2024-06-17 MED ORDER — SODIUM CHLORIDE 0.9 % IV SOLN
90.0000 mg/m2 | Freq: Once | INTRAVENOUS | Status: AC
  Administered 2024-06-17: 200 mg via INTRAVENOUS
  Filled 2024-06-17: qty 8

## 2024-06-17 MED ORDER — SODIUM CHLORIDE 0.9 % IV SOLN: INTRAVENOUS | Status: DC

## 2024-06-17 MED ORDER — DIPHENHYDRAMINE HCL 25 MG PO CAPS
50.0000 mg | ORAL_CAPSULE | Freq: Once | ORAL | Status: AC
  Administered 2024-06-17: 50 mg via ORAL
  Filled 2024-06-17: qty 2

## 2024-06-17 MED ORDER — ACETAMINOPHEN 325 MG PO TABS
650.0000 mg | ORAL_TABLET | Freq: Once | ORAL | Status: AC
  Administered 2024-06-17: 650 mg via ORAL
  Filled 2024-06-17: qty 2

## 2024-06-17 MED ORDER — PALONOSETRON HCL INJECTION 0.25 MG/5ML
0.2500 mg | Freq: Once | INTRAVENOUS | Status: AC
  Administered 2024-06-17: 0.25 mg via INTRAVENOUS
  Filled 2024-06-17: qty 5

## 2024-06-17 MED ORDER — DEXAMETHASONE SOD PHOSPHATE PF 10 MG/ML IJ SOLN
10.0000 mg | Freq: Once | INTRAMUSCULAR | Status: AC
  Administered 2024-06-17: 10 mg via INTRAVENOUS

## 2024-06-17 MED ORDER — SODIUM CHLORIDE 0.9 % IV SOLN
375.0000 mg/m2 | Freq: Once | INTRAVENOUS | Status: AC
  Administered 2024-06-17: 800 mg via INTRAVENOUS
  Filled 2024-06-17: qty 50

## 2024-06-17 NOTE — Patient Instructions (Signed)
 [facility] - A DEPT OF [facility]  Discharge Instructions: Thank you for choosing [facility] to provide your oncology and hematology care.   If you have a lab appointment with the Cancer Center, please go directly to the Cancer Center and check in at the registration area.   Wear comfortable clothing and clothing appropriate for easy access to any Portacath or PICC line.   We strive to give you quality time with your provider. You may need to reschedule your appointment if you arrive late (15 or more minutes).  Arriving late affects you and other patients whose appointments are after yours.  Also, if you miss three or more appointments without notifying the office, you may be dismissed from the clinic at the provider's discretion.      For prescription refill requests, have your pharmacy contact our office and allow 72 hours for refills to be completed.    Today you received the following chemotherapy and/or immunotherapy agents: Rituximab , Bendamustine       To help prevent nausea and vomiting after your treatment, we encourage you to take your nausea medication as directed.  BELOW ARE SYMPTOMS THAT SHOULD BE REPORTED IMMEDIATELY: *FEVER GREATER THAN 100.4 F (38 C) OR HIGHER *CHILLS OR SWEATING *NAUSEA AND VOMITING THAT IS NOT CONTROLLED WITH YOUR NAUSEA MEDICATION *UNUSUAL SHORTNESS OF BREATH *UNUSUAL BRUISING OR BLEEDING *URINARY PROBLEMS (pain or burning when urinating, or frequent urination) *BOWEL PROBLEMS (unusual diarrhea, constipation, pain near the anus) TENDERNESS IN MOUTH AND THROAT WITH OR WITHOUT PRESENCE OF ULCERS (sore throat, sores in mouth, or a toothache) UNUSUAL RASH, SWELLING OR PAIN  UNUSUAL VAGINAL DISCHARGE OR ITCHING   Items with * indicate a potential emergency and should be followed up as soon as possible or go to the Emergency Department if any problems should occur.  Please show the CHEMOTHERAPY ALERT CARD or  IMMUNOTHERAPY ALERT CARD at check-in to the Emergency Department and triage nurse.  Should you have questions after your visit or need to cancel or reschedule your appointment, please contact [facility] - A DEPT OF [facility]  Dept: [phone]  and follow the prompts.  Office hours are 8:00 a.m. to 4:30 p.m. Monday - Friday. Please note that voicemails left after 4:00 p.m. may not be returned until the following business day.  We are closed weekends and major holidays. You have access to a nurse at all times for urgent questions. Please call the main number to the clinic Dept: [phone] and follow the prompts.   For any non-urgent questions, you may also contact your provider using MyChart. We now offer e-Visits for anyone 21 and older to request care online for non-urgent symptoms. For details visit [email or web address].   Also download the MyChart app! Go to the app store, search MyChart, open the app, select [facility], and log in with your MyChart username and password.

## 2024-06-17 NOTE — Progress Notes (Signed)
 [facility] [phone]   [phone]  PROGRESS NOTE  Patient Care Team: Katrinka Garnette KIDD, MD as PCP - General (Family Medicine) Cloretta Arley NOVAK, MD as Consulting Physician (Hematology and Oncology) Elana Montie CROME, RN as Nurse Navigator  Hematological/Oncological History # Follicular Lymphoma Stage III, Grade 3a  12/26/2023: right cervical lymph node biopsy showed follicular lymphoma with focal centroblasts approaching 15/HPF suggestive of grade 3A, i.e. high-grade, follicular lymphoma  01/16/2024: establish care with Dr. Federico  01/25/2024: NM PET CT scan showed evidence of active lymphoma within the neck, chest, abdomen, and pelvis. ([place]) 5, as well as mild splenic hypermetabolism  02/25/2024: Cycle 1 Day 1 of R-Benda Chemotherapy 03/24/2024:  Cycle 2 Day 1 of R-Benda Chemotherapy 04/21/2024: Cycle 3 Day 1 of R-Benda Chemotherapy 05/19/2024:Cycle 4 Day 1 of R-Benda Chemotherapy 06/17/2024: Cycle 5 Day 1 of R-Benda Chemotherapy  Interval History:  Tracy Bradley 68 y.o. female with medical history significant for follicular lymphoma, grade 3A, stage III who presents for a follow up visit. The patient's last visit was on 05/20/2024. In the interim since the last visit she has continued R-Benda chemotherapy.   On exam today Ms. Vasey is accompanied by her husband.  She reports that she tolerated cycle 4 far better than cycle 3.  She reports she did have some fatigue and tiredness with a little bit of diarrhea x 1 day which responded well to Imodium.  She does not have any nausea or vomiting.  She reports her appetite is good.  She notes that she has a strong desire to eat and she has good energy.  She reports that she is able to trim the bushes and do yard work outside.  She has had no infectious symptoms such as runny nose, sore throat, cough.  She did receive her flu shot last week.  She has had no overt signs of bleeding such as nosebleeds, gum  bleeding, or blood in the urine/stool.  She is doing her best to stay hydrated.  Overall she feels well and has no additional questions concerns or complaints today.  She is willing and able to proceed with chemotherapy.  A full 10 point ROS otherwise negative.  MEDICAL HISTORY:  Past Medical History:  Diagnosis Date   Anemia    since childhood   Ileus following gastrointestinal surgery (HCC) 04/22/2012   Muscle strain 04/22/2012    SURGICAL HISTORY: Past Surgical History:  Procedure Laterality Date   BACK SURGERY     1980- low back ruptured disk   IR IMAGING GUIDED PORT INSERTION  02/21/2024   LAPAROSCOPIC APPENDECTOMY  04/14/2012   Procedure: APPENDECTOMY LAPAROSCOPIC;  Surgeon: Alm VEAR Angle, MD;  Location: [facility];  Service: General;  Laterality: N/A;   LYMPH NODE BIOPSY Right 02/06/2024   Procedure: RIGHT NECK CERVICAL LYMPH NODE BIOPSY;  Surgeon: Tobie Eldora NOVAK, MD;  Location: [facility];  Service: ENT;  Laterality: Right;   TONSILLECTOMY     1980s    SOCIAL HISTORY: Social History   Socioeconomic History   Marital status: Married    Spouse name: Not on file   Number of children: Not on file   Years of education: Not on file   Highest education level: Not on file  Occupational History   Not on file  Tobacco Use   Smoking status: Never   Smokeless tobacco: Never  Vaping Use   Vaping status: Never Used  Substance and Sexual Activity   Alcohol use: No  Drug use: No   Sexual activity: Yes    Birth control/protection: Post-menopausal    Comment: husband only  Other Topics Concern   Not on file  Social History Narrative   Married. 3 children- 40, 38, 25 in 2025- 2 sons and a daughter. No grandkids yet.       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 drug abuse (children had gone there)   Social Drivers of Corporate investment banker Strain: Not on file  Food Insecurity: No Food  Insecurity (01/16/2024)   Hunger Vital Sign    Worried About Running Out of Food in the Last Year: Never true    Ran Out of Food in the Last Year: Never true  Transportation Needs: No Transportation Needs (01/16/2024)   PRAPARE - Administrator, Civil Service (Medical): No    Lack of Transportation (Non-Medical): No  Physical Activity: Not on file  Stress: Not on file  Social Connections: Not on file  Intimate Partner Violence: Not At Risk (01/16/2024)   Humiliation, Afraid, Rape, and Kick questionnaire    Fear of Current or Ex-Partner: No    Emotionally Abused: No    Physically Abused: No    Sexually Abused: No    FAMILY HISTORY: Family History  Problem Relation Age of Onset   Stroke Mother    Lymphoma Father    Drug abuse Daughter    Depression Daughter    Bipolar disorder Daughter    Breast cancer Maternal Aunt 64 - 58   Breast cancer Paternal Aunt 36 - 59   Stroke Maternal Grandmother    Stroke Paternal Grandfather    Diabetes Brother    Hyperlipidemia Brother    Kidney disease Brother        dehydration and stopped dm meds etc- thankfully had recovery   Depression Brother    Drug abuse Brother    Drug abuse Son    Depression Son    BRCA 1/2 Neg Hx     ALLERGIES:  has no known allergies.  MEDICATIONS:  Current Outpatient Medications  Medication Sig Dispense Refill   acetaminophen  (TYLENOL ) 500 MG tablet Take 2 tablets (1,000 mg total) by mouth every 6 (six) hours as needed. 30 tablet 0   allopurinol  (ZYLOPRIM ) 300 MG tablet Take 1 tablet (300 mg total) by mouth daily. 90 tablet 1   ibuprofen  (ADVIL ) 200 MG tablet Take 2 tablets (400 mg total) by mouth every 6 (six) hours as needed. 30 tablet 0   lidocaine -prilocaine  (EMLA ) cream APPLY TOPICALLY 1 APPLICATION AS NEEDED 30 g 0   Multiple Vitamins-Minerals (MULTIVITAMIN WITH MINERALS) tablet Take 1 tablet by mouth daily.     ondansetron  (ZOFRAN ) 8 MG tablet Take 1 tablet (8 mg total) by mouth every 8  (eight) hours as needed. 30 tablet 0   prochlorperazine  (COMPAZINE ) 10 MG tablet TAKE 1 TABLET BY MOUTH EVERY 6 HOURS AS NEEDED FOR NAUSEA OR VOMITING. 30 tablet 0   No current facility-administered medications for this visit.   Facility-Administered Medications Ordered in Other Visits  Medication Dose Route Frequency Provider Last Rate Last Admin   0.9 %  sodium chloride  infusion   Intravenous Continuous Federico Norleen DASEN IV, MD 10 mL/hr at 06/17/24 1221 New Bag at 06/17/24 1221   bendamustine  (BENDEKA ) 200 mg in sodium chloride  0.9 % 50 mL (3.4483 mg/mL) chemo infusion  90 mg/m2 (Treatment Plan Recorded) Intravenous  Once Federico Norleen ONEIDA MADISON, MD        REVIEW OF SYSTEMS:   Constitutional: ( - ) fevers, ( - )  chills , ( - ) night sweats Eyes: ( - ) blurriness of vision, ( - ) double vision, ( - ) watery eyes Ears, nose, mouth, throat, and face: ( - ) mucositis, ( - ) sore throat Respiratory: ( - ) cough, ( - ) dyspnea, ( - ) wheezes Cardiovascular: ( - ) palpitation, ( - ) chest discomfort, ( - ) lower extremity swelling Gastrointestinal:  ( - ) nausea, ( - ) heartburn, ( - ) change in bowel habits Skin: ( - ) abnormal skin rashes Lymphatics: ( - ) new lymphadenopathy, ( - ) easy bruising Neurological: ( - ) numbness, ( - ) tingling, ( - ) new weaknesses Behavioral/Psych: ( - ) mood change, ( - ) new changes  All other systems were reviewed with the patient and are negative.  PHYSICAL EXAMINATION: ECOG PERFORMANCE STATUS: 1 - Symptomatic but completely ambulatory  Vitals:   06/17/24 1155  BP: (!) 140/90  Pulse: 79  Resp: 15  Temp: 98.2 F (36.8 C)  SpO2: 99%       Filed Weights   06/17/24 1155  Weight: 206 lb (93.4 kg)        GENERAL: alert, no distress and comfortable SKIN: skin color, texture, turgor are normal, no rashes or significant lesions EYES: conjunctiva are pink and non-injected, sclera clear OROPHARYNX: no exudate, no erythema; lips, buccal mucosa, and  tongue normal  NECK: supple, non-tender LYMPH:  no palpable lymphadenopathy in the cervical, axillary or inguinal LUNGS: clear to auscultation and percussion with normal breathing effort HEART: regular rate & rhythm and no murmurs and no lower extremity edema ABDOMEN: soft, non-tender, non-distended, normal bowel sounds Musculoskeletal: no cyanosis of digits and no clubbing  PSYCH: alert & oriented x 3, fluent speech NEURO: no focal motor/sensory deficits  LABORATORY DATA:  I have reviewed the data as listed    Latest Ref Rng & Units 06/17/2024   10:57 AM 05/20/2024   10:58 AM 04/21/2024    8:38 AM  CBC  WBC 4.0 - 10.5 K/uL 4.9  5.4  4.5   Hemoglobin 12.0 - 15.0 g/dL 88.3  87.2  88.3   Hematocrit 36.0 - 46.0 % 34.2  36.6  34.3   Platelets 150 - 400 K/uL 249  231  220        Latest Ref Rng & Units 06/17/2024   10:57 AM 05/20/2024   10:58 AM 04/21/2024    8:38 AM  CMP  Glucose 70 - 99 mg/dL 889  884  893   BUN 8 - 23 mg/dL 14  13  16    Creatinine 0.44 - 1.00 mg/dL 9.35  9.19  9.24   Sodium 135 - 145 mmol/L 141  140  140   Potassium 3.5 - 5.1 mmol/L 3.8  3.9  3.8   Chloride 98 - 111 mmol/L 107  103  104   CO2 22 - 32 mmol/L 28  30  30    Calcium 8.9 - 10.3 mg/dL 9.5  9.5  9.2   Total Protein 6.5 - 8.1 g/dL 6.5  7.0  6.4   Total Bilirubin 0.0 - 1.2 mg/dL 0.3  0.4  0.5   Alkaline Phos 38 - 126 U/L 117  153  112   AST 15 - 41 U/L 29  35  29   ALT 0 - 44  U/L 25  38  31     RADIOGRAPHIC STUDIES: I have personally reviewed the radiological images as listed and agreed with the findings in the report. No results found.   ASSESSMENT & PLAN Tracy Bradley 68 y.o. female with medical history significant for follicular lymphoma, grade 3A, stage III who presents for a follow up visit.  After review of the labs, review of the records, and discussion with the patient the patients findings are most consistent with high-grade follicular lymphoma, staging in process.   Previously we  discussed the nature of follicular lymphoma and steps moving forward.  We discussed that it tends to be a more indolent lymphoma and the treatment may not be required immediately.  We discussed criteria for treatment including bulky disease as well as rapid progression.  Additionally we discussed that her blood counts appear stable at last check, we will recheck these today.  Pending results that scan we will will determine next best steps moving forward.  The patient voiced understanding of our findings, the nature of the disease, and the plan moving forward.    # Follicular Lymphoma grade 3a, Stage III -- Needle core biopsy confirmed follicular lymphoma, grade 3A (high-grade). excisional biopsy confirmed no transformation to more aggressive lymphoma.  -- PET CT scan confirms stage III disease with involvement on both sides of the diaphragm.  No evidence of organ invasion or bone marrow involvement --port placement scheduled for 02/21/2024.  --gave the patient options for monotherapy rituximab  versus Bendamustine  and rituximab .  Both would be effective treatments.  Given her young age I do believe the combination of chemotherapy and immunotherapy would be the best option, though immunotherapy alone would be reasonable.  Presented these options to the patient today. PLAN: -- will proceed with Bendamustine  and rituximab  q. 28 days x 6 cycles. Today is Cycle 5 Day 1  --labs today show WBC 4.9, Hgb 11.6, MCV 95, Plt 249 -- Plan for return to clinic for next cycle in 4 weeks time  #Supportive Care -- chemotherapy education complete -- port placement on 02/21/2024.  -- zofran  8mg  q8H PRN and compazine  10mg  PO q6H for nausea -- allopurinol  300mg  PO daily for TLS prophylaxis -- EMLA  cream for port --screened negative for hepatitis B on 02/01/2024.  -- no pain medication required at this time.    Orders Placed This Encounter  Procedures   NM PET Image Restag (PS) Skull Base To Thigh    Standing Status:    Future    Expected Date:   08/05/2024    Expiration Date:   06/17/2025    If indicated for the ordered procedure, I authorize the administration of a radiopharmaceutical per Radiology protocol:   Yes    Preferred imaging location?:   [facility]    All questions were answered. The patient knows to call the clinic with any problems, questions or concerns.  A total of more than 30 minutes were spent on this encounter with face-to-face time and non-face-to-face time, including preparing to see the patient, ordering tests and/or medications, counseling the patient and coordination of care as outlined above.   Norleen IVAR Kidney, MD Department of Hematology/Oncology [facility] at [facility] Phone: [phone] Pager: [phone] Email: [email or web address]  06/17/2024 2:54 PM

## 2024-06-18 ENCOUNTER — Inpatient Hospital Stay

## 2024-06-18 ENCOUNTER — Encounter: Payer: Self-pay | Admitting: Hematology and Oncology

## 2024-06-18 VITALS — BP 117/73 | HR 72 | Temp 97.9°F | Resp 16 | Wt 207.5 lb

## 2024-06-18 DIAGNOSIS — C8230 Follicular lymphoma grade IIIa, unspecified site: Secondary | ICD-10-CM | POA: Diagnosis not present

## 2024-06-18 DIAGNOSIS — C8238 Follicular lymphoma grade IIIa, lymph nodes of multiple sites: Secondary | ICD-10-CM

## 2024-06-18 MED ORDER — DEXAMETHASONE SOD PHOSPHATE PF 10 MG/ML IJ SOLN
10.0000 mg | Freq: Once | INTRAMUSCULAR | Status: AC
  Administered 2024-06-18: 10 mg via INTRAVENOUS

## 2024-06-18 MED ORDER — SODIUM CHLORIDE 0.9 % IV SOLN: INTRAVENOUS | Status: DC

## 2024-06-18 MED ORDER — SODIUM CHLORIDE 0.9 % IV SOLN
90.0000 mg/m2 | Freq: Once | INTRAVENOUS | Status: AC
  Administered 2024-06-18: 200 mg via INTRAVENOUS
  Filled 2024-06-18: qty 8

## 2024-06-18 NOTE — Patient Instructions (Signed)
 [facility] - A DEPT OF [facility]  Discharge Instructions: Thank you for choosing [facility] to provide your oncology and hematology care.   If you have a lab appointment with the Cancer Center, please go directly to the Cancer Center and check in at the registration area.   Wear comfortable clothing and clothing appropriate for easy access to any Portacath or PICC line.   We strive to give you quality time with your provider. You may need to reschedule your appointment if you arrive late (15 or more minutes).  Arriving late affects you and other patients whose appointments are after yours.  Also, if you miss three or more appointments without notifying the office, you may be dismissed from the clinic at the provider's discretion.      For prescription refill requests, have your pharmacy contact our office and allow 72 hours for refills to be completed.    Today you received the following chemotherapy and/or immunotherapy agents bendeka      To help prevent nausea and vomiting after your treatment, we encourage you to take your nausea medication as directed.  BELOW ARE SYMPTOMS THAT SHOULD BE REPORTED IMMEDIATELY: *FEVER GREATER THAN 100.4 F (38 C) OR HIGHER *CHILLS OR SWEATING *NAUSEA AND VOMITING THAT IS NOT CONTROLLED WITH YOUR NAUSEA MEDICATION *UNUSUAL SHORTNESS OF BREATH *UNUSUAL BRUISING OR BLEEDING *URINARY PROBLEMS (pain or burning when urinating, or frequent urination) *BOWEL PROBLEMS (unusual diarrhea, constipation, pain near the anus) TENDERNESS IN MOUTH AND THROAT WITH OR WITHOUT PRESENCE OF ULCERS (sore throat, sores in mouth, or a toothache) UNUSUAL RASH, SWELLING OR PAIN  UNUSUAL VAGINAL DISCHARGE OR ITCHING   Items with * indicate a potential emergency and should be followed up as soon as possible or go to the Emergency Department if any problems should occur.  Please show the CHEMOTHERAPY ALERT CARD or IMMUNOTHERAPY  ALERT CARD at check-in to the Emergency Department and triage nurse.  Should you have questions after your visit or need to cancel or reschedule your appointment, please contact [facility] - A DEPT OF [facility]  Dept: [phone]  and follow the prompts.  Office hours are 8:00 a.m. to 4:30 p.m. Monday - Friday. Please note that voicemails left after 4:00 p.m. may not be returned until the following business day.  We are closed weekends and major holidays. You have access to a nurse at all times for urgent questions. Please call the main number to the clinic Dept: [phone] and follow the prompts.   For any non-urgent questions, you may also contact your provider using MyChart. We now offer e-Visits for anyone 8 and older to request care online for non-urgent symptoms. For details visit [email or web address].   Also download the MyChart app! Go to the app store, search "MyChart", open the app, select [place], and log in with your MyChart username and password.

## 2024-07-09 ENCOUNTER — Other Ambulatory Visit: Payer: Self-pay

## 2024-07-15 ENCOUNTER — Inpatient Hospital Stay: Attending: Hematology and Oncology

## 2024-07-15 ENCOUNTER — Inpatient Hospital Stay

## 2024-07-15 ENCOUNTER — Inpatient Hospital Stay: Admitting: Hematology and Oncology

## 2024-07-15 VITALS — BP 97/65 | HR 63 | Temp 98.0°F | Resp 19

## 2024-07-15 VITALS — BP 121/73 | HR 77 | Resp 17 | Ht 68.0 in | Wt 202.0 lb

## 2024-07-15 DIAGNOSIS — Z79899 Other long term (current) drug therapy: Secondary | ICD-10-CM | POA: Insufficient documentation

## 2024-07-15 DIAGNOSIS — Z806 Family history of leukemia: Secondary | ICD-10-CM | POA: Insufficient documentation

## 2024-07-15 DIAGNOSIS — Z803 Family history of malignant neoplasm of breast: Secondary | ICD-10-CM | POA: Diagnosis not present

## 2024-07-15 DIAGNOSIS — C8230 Follicular lymphoma grade IIIa, unspecified site: Secondary | ICD-10-CM | POA: Insufficient documentation

## 2024-07-15 DIAGNOSIS — C8238 Follicular lymphoma grade IIIa, lymph nodes of multiple sites: Secondary | ICD-10-CM

## 2024-07-15 DIAGNOSIS — D649 Anemia, unspecified: Secondary | ICD-10-CM | POA: Insufficient documentation

## 2024-07-15 DIAGNOSIS — Z95828 Presence of other vascular implants and grafts: Secondary | ICD-10-CM

## 2024-07-15 DIAGNOSIS — Z5111 Encounter for antineoplastic chemotherapy: Secondary | ICD-10-CM | POA: Insufficient documentation

## 2024-07-15 DIAGNOSIS — K567 Ileus, unspecified: Secondary | ICD-10-CM | POA: Insufficient documentation

## 2024-07-15 LAB — CMP (CANCER CENTER ONLY)
ALT: 20 U/L (ref 0–44)
AST: 27 U/L (ref 15–41)
Albumin: 4.2 g/dL (ref 3.5–5.0)
Alkaline Phosphatase: 121 U/L (ref 38–126)
Anion gap: 6 (ref 5–15)
BUN: 10 mg/dL (ref 8–23)
CO2: 30 mmol/L (ref 22–32)
Calcium: 9.5 mg/dL (ref 8.9–10.3)
Chloride: 103 mmol/L (ref 98–111)
Creatinine: 0.77 mg/dL (ref 0.44–1.00)
GFR, Estimated: >60 mL/min (ref >60)
Glucose, Bld: 108 mg/dL — ABNORMAL HIGH (ref 70–99)
Potassium: 3.9 mmol/L (ref 3.5–5.1)
Sodium: 139 mmol/L (ref 135–145)
Total Bilirubin: 0.5 mg/dL (ref 0.0–1.2)
Total Protein: 6.6 g/dL (ref 6.5–8.1)

## 2024-07-15 LAB — CBC WITH DIFFERENTIAL (CANCER CENTER ONLY)
Abs Immature Granulocytes: 0.01 K/uL (ref 0.00–0.07)
Basophils Absolute: 0.1 K/uL (ref 0.0–0.1)
Basophils Relative: 1 %
Eosinophils Absolute: 0.3 K/uL (ref 0.0–0.5)
Eosinophils Relative: 6 %
HCT: 35.1 % — ABNORMAL LOW (ref 36.0–46.0)
Hemoglobin: 12.1 g/dL (ref 12.0–15.0)
Immature Granulocytes: 0 %
Lymphocytes Relative: 5 %
Lymphs Abs: 0.2 K/uL — ABNORMAL LOW (ref 0.7–4.0)
MCH: 32.9 pg (ref 26.0–34.0)
MCHC: 34.5 g/dL (ref 30.0–36.0)
MCV: 95.4 fL (ref 80.0–100.0)
Monocytes Absolute: 0.6 K/uL (ref 0.1–1.0)
Monocytes Relative: 12 %
Neutro Abs: 3.5 K/uL (ref 1.7–7.7)
Neutrophils Relative %: 76 %
Platelet Count: 230 K/uL (ref 150–400)
RBC: 3.68 MIL/uL — ABNORMAL LOW (ref 3.87–5.11)
RDW: 13.4 % (ref 11.5–15.5)
WBC Count: 4.6 K/uL (ref 4.0–10.5)
nRBC: 0 % (ref 0.0–0.2)

## 2024-07-15 MED ORDER — PALONOSETRON HCL INJECTION 0.25 MG/5ML
0.2500 mg | Freq: Once | INTRAVENOUS | Status: AC
  Administered 2024-07-15: 0.25 mg via INTRAVENOUS
  Filled 2024-07-15: qty 5

## 2024-07-15 MED ORDER — SODIUM CHLORIDE 0.9 % IV SOLN
90.0000 mg/m2 | Freq: Once | INTRAVENOUS | Status: AC
  Administered 2024-07-15: 200 mg via INTRAVENOUS
  Filled 2024-07-15: qty 8

## 2024-07-15 MED ORDER — SODIUM CHLORIDE 0.9 % IV SOLN
375.0000 mg/m2 | Freq: Once | INTRAVENOUS | Status: AC
  Administered 2024-07-15: 800 mg via INTRAVENOUS
  Filled 2024-07-15: qty 50

## 2024-07-15 MED ORDER — ACETAMINOPHEN 325 MG PO TABS
650.0000 mg | ORAL_TABLET | Freq: Once | ORAL | Status: AC
  Administered 2024-07-15: 650 mg via ORAL
  Filled 2024-07-15: qty 2

## 2024-07-15 MED ORDER — DIPHENHYDRAMINE HCL 25 MG PO CAPS
50.0000 mg | ORAL_CAPSULE | Freq: Once | ORAL | Status: AC
  Administered 2024-07-15: 50 mg via ORAL
  Filled 2024-07-15: qty 2

## 2024-07-15 MED ORDER — DEXAMETHASONE SOD PHOSPHATE PF 10 MG/ML IJ SOLN
10.0000 mg | Freq: Once | INTRAMUSCULAR | Status: AC
  Administered 2024-07-15: 10 mg via INTRAVENOUS

## 2024-07-15 MED ORDER — SODIUM CHLORIDE 0.9 % IV SOLN: INTRAVENOUS | Status: DC

## 2024-07-15 NOTE — Progress Notes (Signed)
 [facility] [phone]   [phone]  PROGRESS NOTE  Patient Care Team: Katrinka Garnette KIDD, MD as PCP - General (Family Medicine) Cloretta Arley NOVAK, MD as Consulting Physician (Hematology and Oncology) Elana Montie CROME, RN as Nurse Navigator  Hematological/Oncological History # Follicular Lymphoma Stage III, Grade 3a  12/26/2023: right cervical lymph node biopsy showed follicular lymphoma with focal centroblasts approaching 15/HPF suggestive of grade 3A, i.e. high-grade, follicular lymphoma  01/16/2024: establish care with Dr. Federico  01/25/2024: NM PET CT scan showed evidence of active lymphoma within the neck, chest, abdomen, and pelvis. ([place]) 5, as well as mild splenic hypermetabolism  02/25/2024: Cycle 1 Day 1 of R-Benda Chemotherapy 03/24/2024:  Cycle 2 Day 1 of R-Benda Chemotherapy 04/21/2024: Cycle 3 Day 1 of R-Benda Chemotherapy 05/19/2024:Cycle 4 Day 1 of R-Benda Chemotherapy 06/17/2024: Cycle 5 Day 1 of R-Benda Chemotherapy 07/15/2024: Cycle 6 Day 1 of R-Benda Chemotherapy  Interval History:  Tracy Bradley 68 y.o. female with medical history significant for follicular lymphoma, grade 3A, stage III who presents for a follow up visit. The patient's last visit was on 06/17/2024. In the interim since the last visit she has continued R-Benda chemotherapy.   On exam today Tracy Bradley is accompanied by her husband.  She reports has been well overall since her last visit and tolerated her last cycle of chemotherapy well.  She reports she is looking forward to Thanksgiving where her husband cooks the turkey and the gravy.  She takes care of the sites.  She reports that her last cycle had no issues with nausea, Oni, or diarrhea.  She denies any fevers, chills, sweats, runny nose, sore throat, cough.  She reports her appetite remains good and her energy and strength are excellent.  She recently went on some hikes in [place] and [place].  She notes that  she has had no other symptoms as a result of her chemotherapy treatment.  She feels like the lymph nodes in her neck are back to normal size.  Overall she is willing and able to proceed with treatment today.  Full 10 point ROS is otherwise negative.  MEDICAL HISTORY:  Past Medical History:  Diagnosis Date   Anemia    since childhood   Ileus following gastrointestinal surgery (HCC) 04/22/2012   Muscle strain 04/22/2012    SURGICAL HISTORY: Past Surgical History:  Procedure Laterality Date   BACK SURGERY     1980- low back ruptured disk   IR IMAGING GUIDED PORT INSERTION  02/21/2024   LAPAROSCOPIC APPENDECTOMY  04/14/2012   Procedure: APPENDECTOMY LAPAROSCOPIC;  Surgeon: Alm VEAR Angle, MD;  Location: [facility];  Service: General;  Laterality: N/A;   LYMPH NODE BIOPSY Right 02/06/2024   Procedure: RIGHT NECK CERVICAL LYMPH NODE BIOPSY;  Surgeon: Tobie Eldora NOVAK, MD;  Location: [facility] OR;  Service: ENT;  Laterality: Right;   TONSILLECTOMY     1980s    SOCIAL HISTORY: Social History   Socioeconomic History   Marital status: Married    Spouse name: Not on file   Number of children: Not on file   Years of education: Not on file   Highest education level: Not on file  Occupational History   Not on file  Tobacco Use   Smoking status: Never   Smokeless tobacco: Never  Vaping Use   Vaping status: Never Used  Substance and Sexual Activity   Alcohol use: No   Drug use: No   Sexual activity: Yes  Birth control/protection: Post-menopausal    Comment: husband only  Other Topics Concern   Not on file  Social History Narrative   Married. 3 children- 40, 38, 25 in 2025- 2 sons and a daughter. No grandkids yet.       Retired- Manufacturing systems engineer previously      Presenter, Broadcasting: volunteering - theatre stage manager- rehab for alcohol and drug abuse (children had gone there)   Social Drivers of Corporate Investment Banker Strain: Not on file  Food Insecurity: No Food Insecurity  (01/16/2024)   Hunger Vital Sign    Worried About Running Out of Food in the Last Year: Never true    Ran Out of Food in the Last Year: Never true  Transportation Needs: No Transportation Needs (01/16/2024)   PRAPARE - Administrator, Civil Service (Medical): No    Lack of Transportation (Non-Medical): No  Physical Activity: Not on file  Stress: Not on file  Social Connections: Not on file  Intimate Partner Violence: Not At Risk (01/16/2024)   Humiliation, Afraid, Rape, and Kick questionnaire    Fear of Current or Ex-Partner: No    Emotionally Abused: No    Physically Abused: No    Sexually Abused: No    FAMILY HISTORY: Family History  Problem Relation Age of Onset   Stroke Mother    Lymphoma Father    Drug abuse Daughter    Depression Daughter    Bipolar disorder Daughter    Breast cancer Maternal Aunt 64 - 30   Breast cancer Paternal Aunt 51 - 12   Stroke Maternal Grandmother    Stroke Paternal Grandfather    Diabetes Brother    Hyperlipidemia Brother    Kidney disease Brother        dehydration and stopped dm meds etc- thankfully had recovery   Depression Brother    Drug abuse Brother    Drug abuse Son    Depression Son    BRCA 1/2 Neg Hx     ALLERGIES:  has no known allergies.  MEDICATIONS:  Current Outpatient Medications  Medication Sig Dispense Refill   acetaminophen  (TYLENOL ) 500 MG tablet Take 2 tablets (1,000 mg total) by mouth every 6 (six) hours as needed. 30 tablet 0   allopurinol  (ZYLOPRIM ) 300 MG tablet Take 1 tablet (300 mg total) by mouth daily. 90 tablet 1   ibuprofen  (ADVIL ) 200 MG tablet Take 2 tablets (400 mg total) by mouth every 6 (six) hours as needed. 30 tablet 0   lidocaine -prilocaine  (EMLA ) cream APPLY TOPICALLY 1 APPLICATION AS NEEDED 30 g 0   Multiple Vitamins-Minerals (MULTIVITAMIN WITH MINERALS) tablet Take 1 tablet by mouth daily.     ondansetron  (ZOFRAN ) 8 MG tablet Take 1 tablet (8 mg total) by mouth every 8 (eight) hours as  needed. 30 tablet 0   prochlorperazine  (COMPAZINE ) 10 MG tablet TAKE 1 TABLET BY MOUTH EVERY 6 HOURS AS NEEDED FOR NAUSEA OR VOMITING. 30 tablet 0   No current facility-administered medications for this visit.    REVIEW OF SYSTEMS:   Constitutional: ( - ) fevers, ( - )  chills , ( - ) night sweats Eyes: ( - ) blurriness of vision, ( - ) double vision, ( - ) watery eyes Ears, nose, mouth, throat, and face: ( - ) mucositis, ( - ) sore throat Respiratory: ( - ) cough, ( - ) dyspnea, ( - ) wheezes Cardiovascular: ( - ) palpitation, ( - ) chest discomfort, ( - )  lower extremity swelling Gastrointestinal:  ( - ) nausea, ( - ) heartburn, ( - ) change in bowel habits Skin: ( - ) abnormal skin rashes Lymphatics: ( - ) new lymphadenopathy, ( - ) easy bruising Neurological: ( - ) numbness, ( - ) tingling, ( - ) new weaknesses Behavioral/Psych: ( - ) mood change, ( - ) new changes  All other systems were reviewed with the patient and are negative.  PHYSICAL EXAMINATION: ECOG PERFORMANCE STATUS: 1 - Symptomatic but completely ambulatory  Vitals:   07/15/24 1032  BP: 121/73  Pulse: 77  Resp: 17  SpO2: 100%        Filed Weights   07/15/24 1032  Weight: 202 lb (91.6 kg)         GENERAL: alert, no distress and comfortable SKIN: skin color, texture, turgor are normal, no rashes or significant lesions EYES: conjunctiva are pink and non-injected, sclera clear OROPHARYNX: no exudate, no erythema; lips, buccal mucosa, and tongue normal  NECK: supple, non-tender LYMPH:  no palpable lymphadenopathy in the cervical, axillary or inguinal LUNGS: clear to auscultation and percussion with normal breathing effort HEART: regular rate & rhythm and no murmurs and no lower extremity edema ABDOMEN: soft, non-tender, non-distended, normal bowel sounds Musculoskeletal: no cyanosis of digits and no clubbing  PSYCH: alert & oriented x 3, fluent speech NEURO: no focal motor/sensory  deficits  LABORATORY DATA:  I have reviewed the data as listed    Latest Ref Rng & Units 07/15/2024    9:53 AM 06/17/2024   10:57 AM 05/20/2024   10:58 AM  CBC  WBC 4.0 - 10.5 K/uL 4.6  4.9  5.4   Hemoglobin 12.0 - 15.0 g/dL 87.8  88.3  87.2   Hematocrit 36.0 - 46.0 % 35.1  34.2  36.6   Platelets 150 - 400 K/uL 230  249  231        Latest Ref Rng & Units 07/15/2024    9:53 AM 06/17/2024   10:57 AM 05/20/2024   10:58 AM  CMP  Glucose 70 - 99 mg/dL 891  889  884   BUN 8 - 23 mg/dL 10  14  13    Creatinine 0.44 - 1.00 mg/dL 9.22  9.35  9.19   Sodium 135 - 145 mmol/L 139  141  140   Potassium 3.5 - 5.1 mmol/L 3.9  3.8  3.9   Chloride 98 - 111 mmol/L 103  107  103   CO2 22 - 32 mmol/L 30  28  30    Calcium 8.9 - 10.3 mg/dL 9.5  9.5  9.5   Total Protein 6.5 - 8.1 g/dL 6.6  6.5  7.0   Total Bilirubin 0.0 - 1.2 mg/dL 0.5  0.3  0.4   Alkaline Phos 38 - 126 U/L 121  117  153   AST 15 - 41 U/L 27  29  35   ALT 0 - 44 U/L 20  25  38     RADIOGRAPHIC STUDIES: I have personally reviewed the radiological images as listed and agreed with the findings in the report. No results found.   ASSESSMENT & PLAN Tracy Bradley 68 y.o. female with medical history significant for follicular lymphoma, grade 3A, stage III who presents for a follow up visit.  After review of the labs, review of the records, and discussion with the patient the patients findings are most consistent with high-grade follicular lymphoma, staging in process.   Previously we discussed the nature of  follicular lymphoma and steps moving forward.  We discussed that it tends to be a more indolent lymphoma and the treatment may not be required immediately.  We discussed criteria for treatment including bulky disease as well as rapid progression.  Additionally we discussed that her blood counts appear stable at last check, we will recheck these today.  Pending results that scan we will will determine next best steps moving forward.   The patient voiced understanding of our findings, the nature of the disease, and the plan moving forward.    # Follicular Lymphoma grade 3a, Stage III -- Needle core biopsy confirmed follicular lymphoma, grade 3A (high-grade). excisional biopsy confirmed no transformation to more aggressive lymphoma.  -- PET CT scan confirms stage III disease with involvement on both sides of the diaphragm.  No evidence of organ invasion or bone marrow involvement --port placement scheduled for 02/21/2024.  --gave the patient options for monotherapy rituximab  versus Bendamustine  and rituximab .  Both would be effective treatments.  Given her young age I do believe the combination of chemotherapy and immunotherapy would be the best option, though immunotherapy alone would be reasonable.  Presented these options to the patient today. PLAN: -- will proceed with Bendamustine  and rituximab  q. 28 days x 6 cycles. Today is Cycle 6 Day 1  --labs today show WBC 4.6, Hgb 12.1, MCV 95.4, Plt 230. Cr and LFTs WNL.  -- Plan for return to clinic in 4 weeks time with interval PET CT scan (scheduled for 08/04/2024)   #Supportive Care -- chemotherapy education complete -- port placement on 02/21/2024.  Can be removed if PET scan shows good response to treatment in December 2025. -- zofran  8mg  q8H PRN and compazine  10mg  PO q6H for nausea -- allopurinol  300mg  PO daily for TLS prophylaxis -- EMLA  cream for port --screened negative for hepatitis B on 02/01/2024.  -- no pain medication required at this time.    No orders of the defined types were placed in this encounter.   All questions were answered. The patient knows to call the clinic with any problems, questions or concerns.  A total of more than 30 minutes were spent on this encounter with face-to-face time and non-face-to-face time, including preparing to see the patient, ordering tests and/or medications, counseling the patient and coordination of care as outlined above.    Norleen IVAR Kidney, MD Department of Hematology/Oncology [facility] at [facility] Phone: [phone] Pager: [phone] Email: [email or web address]  07/15/2024 11:03 AM

## 2024-07-15 NOTE — Patient Instructions (Signed)
 [facility] - A DEPT OF [facility]  Discharge Instructions: Thank you for choosing [facility] to provide your oncology and hematology care.   If you have a lab appointment with the Cancer Center, please go directly to the Cancer Center and check in at the registration area.   Wear comfortable clothing and clothing appropriate for easy access to any Portacath or PICC line.   We strive to give you quality time with your provider. You may need to reschedule your appointment if you arrive late (15 or more minutes).  Arriving late affects you and other patients whose appointments are after yours.  Also, if you miss three or more appointments without notifying the office, you may be dismissed from the clinic at the provider's discretion.      For prescription refill requests, have your pharmacy contact our office and allow 72 hours for refills to be completed.    Today you received the following chemotherapy and/or immunotherapy agents: rituximab  and bendamustine       To help prevent nausea and vomiting after your treatment, we encourage you to take your nausea medication as directed.  BELOW ARE SYMPTOMS THAT SHOULD BE REPORTED IMMEDIATELY: *FEVER GREATER THAN 100.4 F (38 C) OR HIGHER *CHILLS OR SWEATING *NAUSEA AND VOMITING THAT IS NOT CONTROLLED WITH YOUR NAUSEA MEDICATION *UNUSUAL SHORTNESS OF BREATH *UNUSUAL BRUISING OR BLEEDING *URINARY PROBLEMS (pain or burning when urinating, or frequent urination) *BOWEL PROBLEMS (unusual diarrhea, constipation, pain near the anus) TENDERNESS IN MOUTH AND THROAT WITH OR WITHOUT PRESENCE OF ULCERS (sore throat, sores in mouth, or a toothache) UNUSUAL RASH, SWELLING OR PAIN  UNUSUAL VAGINAL DISCHARGE OR ITCHING   Items with * indicate a potential emergency and should be followed up as soon as possible or go to the Emergency Department if any problems should occur.  Please show the CHEMOTHERAPY ALERT CARD  or IMMUNOTHERAPY ALERT CARD at check-in to the Emergency Department and triage nurse.  Should you have questions after your visit or need to cancel or reschedule your appointment, please contact [facility] - A DEPT OF [facility]  Dept: [phone]  and follow the prompts.  Office hours are 8:00 a.m. to 4:30 p.m. Monday - Friday. Please note that voicemails left after 4:00 p.m. may not be returned until the following business day.  We are closed weekends and major holidays. You have access to a nurse at all times for urgent questions. Please call the main number to the clinic Dept: [phone] and follow the prompts.   For any non-urgent questions, you may also contact your provider using MyChart. We now offer e-Visits for anyone 65 and older to request care online for non-urgent symptoms. For details visit [email or web address].   Also download the MyChart app! Go to the app store, search MyChart, open the app, select [place], and log in with your MyChart username and password.

## 2024-07-16 ENCOUNTER — Inpatient Hospital Stay

## 2024-07-16 VITALS — BP 107/69 | HR 80 | Temp 98.2°F | Resp 18 | Ht 68.0 in | Wt 202.0 lb

## 2024-07-16 DIAGNOSIS — C8238 Follicular lymphoma grade IIIa, lymph nodes of multiple sites: Secondary | ICD-10-CM

## 2024-07-16 DIAGNOSIS — C8230 Follicular lymphoma grade IIIa, unspecified site: Secondary | ICD-10-CM | POA: Diagnosis not present

## 2024-07-16 MED ORDER — SODIUM CHLORIDE 0.9 % IV SOLN
90.0000 mg/m2 | Freq: Once | INTRAVENOUS | Status: AC
  Administered 2024-07-16: 200 mg via INTRAVENOUS
  Filled 2024-07-16: qty 8

## 2024-07-16 MED ORDER — SODIUM CHLORIDE 0.9 % IV SOLN: INTRAVENOUS | Status: DC

## 2024-07-16 MED ORDER — DEXAMETHASONE SOD PHOSPHATE PF 10 MG/ML IJ SOLN
10.0000 mg | Freq: Once | INTRAMUSCULAR | Status: AC
  Administered 2024-07-16: 10 mg via INTRAVENOUS

## 2024-07-17 ENCOUNTER — Other Ambulatory Visit: Payer: Self-pay

## 2024-08-01 ENCOUNTER — Other Ambulatory Visit: Payer: Self-pay | Admitting: Hematology and Oncology

## 2024-08-04 ENCOUNTER — Encounter (HOSPITAL_COMMUNITY)
Admission: RE | Admit: 2024-08-04 | Discharge: 2024-08-04 | Disposition: A | Source: Ambulatory Visit | Attending: Hematology and Oncology

## 2024-08-04 DIAGNOSIS — C8238 Follicular lymphoma grade IIIa, lymph nodes of multiple sites: Secondary | ICD-10-CM

## 2024-08-04 LAB — GLUCOSE, CAPILLARY: Glucose-Capillary: 93 mg/dL (ref 70–99)

## 2024-08-04 MED ORDER — FLUDEOXYGLUCOSE F - 18 (FDG) INJECTION
10.1000 | Freq: Once | INTRAVENOUS | Status: AC
  Administered 2024-08-04: 10.03 via INTRAVENOUS

## 2024-08-11 ENCOUNTER — Other Ambulatory Visit: Payer: Self-pay | Admitting: Hematology and Oncology

## 2024-08-11 DIAGNOSIS — C8238 Follicular lymphoma grade IIIa, lymph nodes of multiple sites: Secondary | ICD-10-CM

## 2024-08-11 NOTE — Progress Notes (Unsigned)
 [facility] [phone]) [phone]   [phone]  PROGRESS NOTE  Patient Care Team: Katrinka Garnette KIDD, MD as PCP - General (Family Medicine) Cloretta Arley NOVAK, MD as Consulting Physician (Hematology and Oncology) Elana Montie CROME, RN as Nurse Navigator  Hematological/Oncological History # Follicular Lymphoma Stage III, Grade 3a  12/26/2023: right cervical lymph node biopsy showed follicular lymphoma with focal centroblasts approaching 15/HPF suggestive of grade 3A, i.e. high-grade, follicular lymphoma  01/16/2024: establish care with Dr. Federico  01/25/2024: NM PET CT scan showed evidence of active lymphoma within the neck, chest, abdomen, and pelvis. (Deauville) 5, as well as mild splenic hypermetabolism  02/25/2024: Cycle 1 Day 1 of R-Benda Chemotherapy 03/24/2024:  Cycle 2 Day 1 of R-Benda Chemotherapy 04/21/2024: Cycle 3 Day 1 of R-Benda Chemotherapy 05/19/2024:Cycle 4 Day 1 of R-Benda Chemotherapy 06/17/2024: Cycle 5 Day 1 of R-Benda Chemotherapy 07/15/2024: Cycle 6 Day 1 of R-Benda Chemotherapy  Interval History:  Tracy Bradley 68 y.o. female with medical history significant for follicular lymphoma, grade 3A, stage III who presents for a follow up visit. The patient's last visit was on 07/15/2024. In the interim since the last visit she has completed R-Benda chemotherapy.   On exam today Tracy Bradley reports that she will be traveling to [place]  to be with her children from [place]  and [place]  for Christmas.  She notes that she has been feeling good other than some fatigue and tiredness.  She reports her energy today is about 8 out of 10.  She is not having any runny nose, sore throat, cough.  She denies any fevers, chills, sweats.  She is not been having any trouble with bumps or lumps.  She reports her port has been working well.  She notes that she is delighted to hear that her PET scan showed a complete metabolic response to chemotherapy.  She reports that there  are no limitations in her day-to-day activities and her appetite is good.  Other than her fatigue she feels quite well.  She has had no issues with infectious symptoms.  A full 10 point ROS is otherwise negative.  The bulk of our discussion focused on the results of the PET scan and steps moving forward for surveillance.  She voiced understanding of our findings and recommendations moving forward.  MEDICAL HISTORY:  Past Medical History:  Diagnosis Date   Anemia    since childhood   Ileus following gastrointestinal surgery (HCC) 04/22/2012   Muscle strain 04/22/2012    SURGICAL HISTORY: Past Surgical History:  Procedure Laterality Date   BACK SURGERY     1980- low back ruptured disk   IR IMAGING GUIDED PORT INSERTION  02/21/2024   LAPAROSCOPIC APPENDECTOMY  04/14/2012   Procedure: APPENDECTOMY LAPAROSCOPIC;  Surgeon: Alm VEAR Angle, MD;  Location: [facility];  Service: General;  Laterality: N/A;   LYMPH NODE BIOPSY Right 02/06/2024   Procedure: RIGHT NECK CERVICAL LYMPH NODE BIOPSY;  Surgeon: Tobie Eldora NOVAK, MD;  Location: [facility] OR;  Service: ENT;  Laterality: Right;   TONSILLECTOMY     1980s    SOCIAL HISTORY: Social History   Socioeconomic History   Marital status: Married    Spouse name: Not on file   Number of children: Not on file   Years of education: Not on file   Highest education level: Not on file  Occupational History   Not on file  Tobacco Use   Smoking status: Never   Smokeless tobacco: Never  Vaping Use  Vaping status: Never Used  Substance and Sexual Activity   Alcohol use: No   Drug use: No   Sexual activity: Yes    Birth control/protection: Post-menopausal    Comment: husband only  Other Topics Concern   Not on file  Social History Narrative   Married. 3 children- 40, 38, 25 in 2025- 2 sons and a daughter. No grandkids yet.       Retired- Manufacturing systems engineer previously      Hobbies: volunteering - theatre stage manager- rehab for alcohol and  drug abuse (children had gone there)   Social Drivers of Health   Tobacco Use: Low Risk (02/21/2024)   Patient History    Smoking Tobacco Use: Never    Smokeless Tobacco Use: Never    Passive Exposure: Not on file  Financial Resource Strain: Not on file  Food Insecurity: No Food Insecurity (01/16/2024)   Hunger Vital Sign    Worried About Running Out of Food in the Last Year: Never true    Ran Out of Food in the Last Year: Never true  Transportation Needs: No Transportation Needs (01/16/2024)   PRAPARE - Administrator, Civil Service (Medical): No    Lack of Transportation (Non-Medical): No  Physical Activity: Not on file  Stress: Not on file  Social Connections: Not on file  Intimate Partner Violence: Not At Risk (01/16/2024)   Humiliation, Afraid, Rape, and Kick questionnaire    Fear of Current or Ex-Partner: No    Emotionally Abused: No    Physically Abused: No    Sexually Abused: No  Depression (PHQ2-9): Low Risk (07/16/2024)   Depression (PHQ2-9)    PHQ-2 Score: 0  Alcohol Screen: Not on file  Housing: Unknown (01/16/2024)   Housing Stability Vital Sign    Unable to Pay for Housing in the Last Year: No    Number of Times Moved in the Last Year: Not on file    Homeless in the Last Year: No  Utilities: Not At Risk (01/16/2024)   AHC Utilities    Threatened with loss of utilities: No  Health Literacy: Not on file    FAMILY HISTORY: Family History  Problem Relation Age of Onset   Stroke Mother    Lymphoma Father    Drug abuse Daughter    Depression Daughter    Bipolar disorder Daughter    Breast cancer Maternal Aunt 20 - 22   Breast cancer Paternal Aunt 63 - 59   Stroke Maternal Grandmother    Stroke Paternal Grandfather    Diabetes Brother    Hyperlipidemia Brother    Kidney disease Brother        dehydration and stopped dm meds etc- thankfully had recovery   Depression Brother    Drug abuse Brother    Drug abuse Son    Depression Son    BRCA 1/2  Neg Hx     ALLERGIES:  has no known allergies.  MEDICATIONS:  Current Outpatient Medications  Medication Sig Dispense Refill   acetaminophen  (TYLENOL ) 500 MG tablet Take 2 tablets (1,000 mg total) by mouth every 6 (six) hours as needed. 30 tablet 0   allopurinol  (ZYLOPRIM ) 300 MG tablet TAKE 1 TABLET BY MOUTH EVERY DAY 90 tablet 1   ibuprofen  (ADVIL ) 200 MG tablet Take 2 tablets (400 mg total) by mouth every 6 (six) hours as needed. 30 tablet 0   lidocaine -prilocaine  (EMLA ) cream APPLY TOPICALLY 1 APPLICATION AS NEEDED 30 g 0   Multiple  Vitamins-Minerals (MULTIVITAMIN WITH MINERALS) tablet Take 1 tablet by mouth daily.     ondansetron  (ZOFRAN ) 8 MG tablet Take 1 tablet (8 mg total) by mouth every 8 (eight) hours as needed. 30 tablet 0   prochlorperazine  (COMPAZINE ) 10 MG tablet TAKE 1 TABLET BY MOUTH EVERY 6 HOURS AS NEEDED FOR NAUSEA OR VOMITING. 30 tablet 0   No current facility-administered medications for this visit.    REVIEW OF SYSTEMS:   Constitutional: ( - ) fevers, ( - )  chills , ( - ) night sweats Eyes: ( - ) blurriness of vision, ( - ) double vision, ( - ) watery eyes Ears, nose, mouth, throat, and face: ( - ) mucositis, ( - ) sore throat Respiratory: ( - ) cough, ( - ) dyspnea, ( - ) wheezes Cardiovascular: ( - ) palpitation, ( - ) chest discomfort, ( - ) lower extremity swelling Gastrointestinal:  ( - ) nausea, ( - ) heartburn, ( - ) change in bowel habits Skin: ( - ) abnormal skin rashes Lymphatics: ( - ) new lymphadenopathy, ( - ) easy bruising Neurological: ( - ) numbness, ( - ) tingling, ( - ) new weaknesses Behavioral/Psych: ( - ) mood change, ( - ) new changes  All other systems were reviewed with the patient and are negative.  PHYSICAL EXAMINATION: ECOG PERFORMANCE STATUS: 1 - Symptomatic but completely ambulatory  Vitals:   08/12/24 1132  BP: 120/78  Pulse: 82  Resp: 14  Temp: 98.4 F (36.9 C)  SpO2: 100%         Filed Weights   08/12/24 1132   Weight: 201 lb 11.2 oz (91.5 kg)          GENERAL: alert, no distress and comfortable SKIN: skin color, texture, turgor are normal, no rashes or significant lesions EYES: conjunctiva are pink and non-injected, sclera clear OROPHARYNX: no exudate, no erythema; lips, buccal mucosa, and tongue normal  NECK: supple, non-tender LYMPH:  no palpable lymphadenopathy in the cervical, axillary or inguinal LUNGS: clear to auscultation and percussion with normal breathing effort HEART: regular rate & rhythm and no murmurs and no lower extremity edema ABDOMEN: soft, non-tender, non-distended, normal bowel sounds Musculoskeletal: no cyanosis of digits and no clubbing  PSYCH: alert & oriented x 3, fluent speech NEURO: no focal motor/sensory deficits  LABORATORY DATA:  I have reviewed the data as listed    Latest Ref Rng & Units 08/12/2024   10:51 AM 07/15/2024    9:53 AM 06/17/2024   10:57 AM  CBC  WBC 4.0 - 10.5 K/uL 3.6  4.6  4.9   Hemoglobin 12.0 - 15.0 g/dL 88.0  87.8  88.3   Hematocrit 36.0 - 46.0 % 34.0  35.1  34.2   Platelets 150 - 400 K/uL 186  230  249        Latest Ref Rng & Units 08/12/2024   10:51 AM 07/15/2024    9:53 AM 06/17/2024   10:57 AM  CMP  Glucose 70 - 99 mg/dL 888  891  889   BUN 8 - 23 mg/dL 12  10  14    Creatinine 0.44 - 1.00 mg/dL 9.17  9.22  9.35   Sodium 135 - 145 mmol/L 139  139  141   Potassium 3.5 - 5.1 mmol/L 3.8  3.9  3.8   Chloride 98 - 111 mmol/L 103  103  107   CO2 22 - 32 mmol/L 25  30  28    Calcium 8.9 -  10.3 mg/dL 9.2  9.5  9.5   Total Protein 6.5 - 8.1 g/dL 6.7  6.6  6.5   Total Bilirubin 0.0 - 1.2 mg/dL 0.4  0.5  0.3   Alkaline Phos 38 - 126 U/L 176  121  117   AST 15 - 41 U/L 36  27  29   ALT 0 - 44 U/L 40  20  25     RADIOGRAPHIC STUDIES: I have personally reviewed the radiological images as listed and agreed with the findings in the report. NM PET Image Restag (PS) Skull Base To Thigh Result Date: 08/06/2024 CLINICAL DATA:   Subsequent treatment strategy for lymphoma. EXAM: NUCLEAR MEDICINE PET SKULL BASE TO THIGH TECHNIQUE: 10.0 mCi F-18 FDG was injected intravenously. Full-ring PET imaging was performed from the skull base to thigh after the radiotracer. CT data was obtained and used for attenuation correction and anatomic localization. Fasting blood glucose: 93 mg/dl COMPARISON:  94/69/7974. FINDINGS: Mediastinal blood pool activity: SUV max 2.8 Liver activity: SUV max 3.9 NECK: No abnormal hypermetabolism. Incidental CT findings: None. CHEST: No abnormal hypermetabolism. Incidental CT findings: Right IJ Port-A-Cath terminates in the right atrium. Heart is at the upper limits of normal in size to mildly enlarged. No pericardial or pleural effusion. ABDOMEN/PELVIS: Left external iliac lymph node has decreased in size and metabolism, now measuring 10 mm (4/66), SUV max 3.8, compared to 2.5 cm and 10.5. Similarly, right internal iliac artery lymph node measures 8 mm (4/154), SUV max 3.0, previously 2.0 cm and 9.3. Marked improvement in abdominal retroperitoneal and mesenteric adenopathy, without residual abnormal hypermetabolism. Spleen is no longer hypermetabolic. Incidental CT findings: None. SKELETON: Scattered degenerative uptake in the spine. No abnormal hypermetabolism. Incidental CT findings: Degenerative changes in the spine. IMPRESSION: 1. Marked interval response to therapy with minimal residual hypermetabolic lymph nodes in the iliac chains bilaterally (Deauville 3). 2. Resolved splenic hypermetabolism. Electronically Signed   By: Newell Eke M.D.   On: 08/06/2024 15:12     ASSESSMENT & PLAN Tracy Bradley 68 y.o. female with medical history significant for follicular lymphoma, grade 3A, stage III who presents for a follow up visit.  After review of the labs, review of the records, and discussion with the patient the patients findings are most consistent with high-grade follicular lymphoma, staging in process.    Previously we discussed the nature of follicular lymphoma and steps moving forward.  We discussed that it tends to be a more indolent lymphoma and the treatment may not be required immediately.  We discussed criteria for treatment including bulky disease as well as rapid progression.  Additionally we discussed that her blood counts appear stable at last check, we will recheck these today.  Pending results that scan we will will determine next best steps moving forward.  The patient voiced understanding of our findings, the nature of the disease, and the plan moving forward.    # Follicular Lymphoma grade 3a, Stage III -- Needle core biopsy confirmed follicular lymphoma, grade 3A (high-grade). excisional biopsy confirmed no transformation to more aggressive lymphoma.  -- PET CT scan confirms stage III disease with involvement on both sides of the diaphragm.  No evidence of organ invasion or bone marrow involvement --port placement scheduled for 02/21/2024.  --gave the patient options for monotherapy rituximab  versus Bendamustine  and rituximab .  Both would be effective treatments.  Given her young age I do believe the combination of chemotherapy and immunotherapy would be the best option, though immunotherapy alone would  be reasonable.  Presented these options to the patient today. PLAN: -- completed 6 cycles of  Bendamustine  and rituximab  q. 28 days x 6 cycles. --labs today show WBC 3.6, Hgb 11.9, MCV 93.9, Plt 186. Cr and LFTs WNL.  --post treatment PET CT scan shows Deauville Score 3, consistent with a metabolic response to treatment.  -- Plan for return to clinic in 3 motnhs time with CT scan in 6 months.   #Supportive Care -- chemotherapy education complete -- port placement on 02/21/2024.  Port can be removed, patient is agreeable to removal.   -- zofran  8mg  q8H PRN and compazine  10mg  PO q6H for nausea -- allopurinol  300mg  PO daily for TLS prophylaxis -- EMLA  cream for port --screened negative  for hepatitis B on 02/01/2024.  -- no pain medication required at this time.    Orders Placed This Encounter  Procedures   IR Removal Tun Access W/ Port W/O FL    Standing Status:   Future    Expiration Date:   08/12/2025    Reason for exam::   chemo completed, requestin port removal.    Preferred Imaging Location?:   [facility]    All questions were answered. The patient knows to call the clinic with any problems, questions or concerns.  A total of more than 30 minutes were spent on this encounter with face-to-face time and non-face-to-face time, including preparing to see the patient, ordering tests and/or medications, counseling the patient and coordination of care as outlined above.   Norleen IVAR Kidney, MD Department of Hematology/Oncology [facility] Phone: [phone] Pager: [phone] Email: [email or web address]  08/12/2024 3:17 PM

## 2024-08-12 ENCOUNTER — Inpatient Hospital Stay: Attending: Hematology and Oncology | Admitting: Hematology and Oncology

## 2024-08-12 ENCOUNTER — Inpatient Hospital Stay: Attending: Hematology and Oncology

## 2024-08-12 VITALS — BP 120/78 | HR 82 | Temp 98.4°F | Resp 14 | Wt 201.7 lb

## 2024-08-12 DIAGNOSIS — Z8719 Personal history of other diseases of the digestive system: Secondary | ICD-10-CM | POA: Insufficient documentation

## 2024-08-12 DIAGNOSIS — Z95828 Presence of other vascular implants and grafts: Secondary | ICD-10-CM | POA: Diagnosis not present

## 2024-08-12 DIAGNOSIS — R5383 Other fatigue: Secondary | ICD-10-CM | POA: Diagnosis not present

## 2024-08-12 DIAGNOSIS — Z9221 Personal history of antineoplastic chemotherapy: Secondary | ICD-10-CM | POA: Insufficient documentation

## 2024-08-12 DIAGNOSIS — Z803 Family history of malignant neoplasm of breast: Secondary | ICD-10-CM | POA: Insufficient documentation

## 2024-08-12 DIAGNOSIS — C8238 Follicular lymphoma grade IIIa, lymph nodes of multiple sites: Secondary | ICD-10-CM

## 2024-08-12 DIAGNOSIS — Z806 Family history of leukemia: Secondary | ICD-10-CM | POA: Insufficient documentation

## 2024-08-12 DIAGNOSIS — C8231 Follicular lymphoma grade IIIa, lymph nodes of head, face, and neck: Secondary | ICD-10-CM | POA: Insufficient documentation

## 2024-08-12 DIAGNOSIS — D649 Anemia, unspecified: Secondary | ICD-10-CM | POA: Insufficient documentation

## 2024-08-12 DIAGNOSIS — M479 Spondylosis, unspecified: Secondary | ICD-10-CM | POA: Insufficient documentation

## 2024-08-12 DIAGNOSIS — I517 Cardiomegaly: Secondary | ICD-10-CM | POA: Insufficient documentation

## 2024-08-12 DIAGNOSIS — Z79899 Other long term (current) drug therapy: Secondary | ICD-10-CM | POA: Diagnosis not present

## 2024-08-12 LAB — CBC WITH DIFFERENTIAL (CANCER CENTER ONLY)
Abs Immature Granulocytes: 0.01 K/uL (ref 0.00–0.07)
Basophils Absolute: 0.1 K/uL (ref 0.0–0.1)
Basophils Relative: 1 %
Eosinophils Absolute: 0.2 K/uL (ref 0.0–0.5)
Eosinophils Relative: 6 %
HCT: 34 % — ABNORMAL LOW (ref 36.0–46.0)
Hemoglobin: 11.9 g/dL — ABNORMAL LOW (ref 12.0–15.0)
Immature Granulocytes: 0 %
Lymphocytes Relative: 10 %
Lymphs Abs: 0.4 K/uL — ABNORMAL LOW (ref 0.7–4.0)
MCH: 32.9 pg (ref 26.0–34.0)
MCHC: 35 g/dL (ref 30.0–36.0)
MCV: 93.9 fL (ref 80.0–100.0)
Monocytes Absolute: 0.5 K/uL (ref 0.1–1.0)
Monocytes Relative: 14 %
Neutro Abs: 2.5 K/uL (ref 1.7–7.7)
Neutrophils Relative %: 69 %
Platelet Count: 186 K/uL (ref 150–400)
RBC: 3.62 MIL/uL — ABNORMAL LOW (ref 3.87–5.11)
RDW: 12.7 % (ref 11.5–15.5)
WBC Count: 3.6 K/uL — ABNORMAL LOW (ref 4.0–10.5)
nRBC: 0 % (ref 0.0–0.2)

## 2024-08-12 LAB — CMP (CANCER CENTER ONLY)
ALT: 40 U/L (ref 0–44)
AST: 36 U/L (ref 15–41)
Albumin: 4.2 g/dL (ref 3.5–5.0)
Alkaline Phosphatase: 176 U/L — ABNORMAL HIGH (ref 38–126)
Anion gap: 10 (ref 5–15)
BUN: 12 mg/dL (ref 8–23)
CO2: 25 mmol/L (ref 22–32)
Calcium: 9.2 mg/dL (ref 8.9–10.3)
Chloride: 103 mmol/L (ref 98–111)
Creatinine: 0.82 mg/dL (ref 0.44–1.00)
GFR, Estimated: >60 mL/min (ref >60)
Glucose, Bld: 111 mg/dL — ABNORMAL HIGH (ref 70–99)
Potassium: 3.8 mmol/L (ref 3.5–5.1)
Sodium: 139 mmol/L (ref 135–145)
Total Bilirubin: 0.4 mg/dL (ref 0.0–1.2)
Total Protein: 6.7 g/dL (ref 6.5–8.1)

## 2024-08-12 LAB — LACTATE DEHYDROGENASE: LDH: 197 U/L (ref 105–235)

## 2024-08-14 ENCOUNTER — Telehealth: Payer: Self-pay | Admitting: Hematology and Oncology

## 2024-08-14 NOTE — Telephone Encounter (Signed)
 I SPOKE WITH PATIENT TO SCHEDULE LAB AND MD APPOINTMENTS FOR 11/12/2024 PER 08/12/2024 LOS. PATIENT AWARE OF DATE/TIME.

## 2024-08-15 ENCOUNTER — Other Ambulatory Visit: Payer: Self-pay

## 2024-08-30 ENCOUNTER — Encounter: Payer: Self-pay | Admitting: Hematology and Oncology

## 2024-09-02 ENCOUNTER — Encounter: Payer: Self-pay | Admitting: Hematology and Oncology

## 2024-09-02 ENCOUNTER — Ambulatory Visit (HOSPITAL_COMMUNITY)
Admission: RE | Admit: 2024-09-02 | Discharge: 2024-09-02 | Disposition: A | Discharge status: Home / Self Care | Payer: Self-pay | Source: Ambulatory Visit | Attending: Hematology and Oncology | Admitting: Hematology and Oncology

## 2024-09-02 DIAGNOSIS — C8590 Non-Hodgkin lymphoma, unspecified, unspecified site: Secondary | ICD-10-CM | POA: Insufficient documentation

## 2024-09-02 DIAGNOSIS — Z95828 Presence of other vascular implants and grafts: Secondary | ICD-10-CM

## 2024-09-02 DIAGNOSIS — Z452 Encounter for adjustment and management of vascular access device: Secondary | ICD-10-CM | POA: Diagnosis present

## 2024-09-02 HISTORY — PX: IR REMOVAL TUN ACCESS W/ PORT W/O FL MOD SED: IMG2290

## 2024-09-02 MED ORDER — LIDOCAINE-EPINEPHRINE 1 %-1:100000 IJ SOLN
20.0000 mL | Freq: Once | INTRAMUSCULAR | Status: AC
  Administered 2024-09-02: 10 mL via INTRADERMAL

## 2024-09-02 MED ORDER — LIDOCAINE-EPINEPHRINE 1 %-1:100000 IJ SOLN
INTRAMUSCULAR | Status: AC
  Filled 2024-09-02: qty 20

## 2024-09-02 NOTE — Procedures (Signed)
 Vascular and Interventional Radiology Procedure Note  Patient: Tracy Bradley DOB: [birth year] Medical Record Number: [id number] Note Date/Time: 09/02/2024 10:32 AM   Performing Physician: Thom Hall, MD Assistant(s): None  Diagnosis: Lymphoma and RX completion  Procedure: PORT REMOVAL  Anesthesia: Local Anesthetic Complications: None Estimated Blood Loss: Minimal Specimens:  None  Findings:  Successful removal of a right-sided venous port. Primary incision closure. Dermabond at skin.  See detailed procedure note with images in PACS. The patient tolerated the procedure well without incident or complication and was returned to Recovery in stable condition.    Thom Hall, MD [facility]   Pager. [phone] Clinic. [phone]

## 2024-09-16 NOTE — Progress Notes (Signed)
 FU 08/12/24 with Dr Federico, 11/18 last cycle chemotherapy and port removal 09/02/24. PET shows complete response. Patient will FU 11/12/24 with Dr Federico. Navigator will sign off.

## 2024-11-12 ENCOUNTER — Inpatient Hospital Stay: Payer: Self-pay

## 2024-11-12 ENCOUNTER — Inpatient Hospital Stay: Payer: Self-pay | Admitting: Hematology and Oncology
